# Patient Record
Sex: Male | Born: 1950
Health system: Southern US, Community
[De-identification: ages and names within clinical notes are randomized; demographics above are authoritative.]

## PROBLEM LIST (undated history)

## (undated) DIAGNOSIS — B009 Herpesviral infection, unspecified: Secondary | ICD-10-CM

## (undated) DIAGNOSIS — I10 Essential (primary) hypertension: Secondary | ICD-10-CM

## (undated) DIAGNOSIS — A159 Respiratory tuberculosis unspecified: Secondary | ICD-10-CM

## (undated) DIAGNOSIS — R897 Abnormal histological findings in specimens from other organs, systems and tissues: Secondary | ICD-10-CM

## (undated) DIAGNOSIS — R7611 Nonspecific reaction to tuberculin skin test without active tuberculosis: Secondary | ICD-10-CM

## (undated) DIAGNOSIS — E785 Hyperlipidemia, unspecified: Secondary | ICD-10-CM

## (undated) DIAGNOSIS — B182 Chronic viral hepatitis C: Secondary | ICD-10-CM

## (undated) DIAGNOSIS — A530 Latent syphilis, unspecified as early or late: Secondary | ICD-10-CM

## (undated) HISTORY — DX: Latent syphilis, unspecified as early or late: A53.0

## (undated) HISTORY — DX: Essential (primary) hypertension: I10

## (undated) HISTORY — DX: Hyperlipidemia, unspecified: E78.5

## (undated) HISTORY — DX: Respiratory tuberculosis unspecified: A15.9

## (undated) HISTORY — DX: Herpesviral infection, unspecified: B00.9

## (undated) HISTORY — DX: Abnormal histological findings in specimens from other organs, systems and tissues: R89.7

## (undated) HISTORY — DX: Chronic viral hepatitis C: B18.2

## (undated) HISTORY — DX: Nonspecific reaction to tuberculin skin test without active tuberculosis: R76.11

---

## 1997-03-12 DIAGNOSIS — A530 Latent syphilis, unspecified as early or late: Secondary | ICD-10-CM

## 1997-03-12 DIAGNOSIS — A159 Respiratory tuberculosis unspecified: Secondary | ICD-10-CM

## 1997-03-12 DIAGNOSIS — B182 Chronic viral hepatitis C: Secondary | ICD-10-CM

## 1997-03-12 DIAGNOSIS — R7611 Nonspecific reaction to tuberculin skin test without active tuberculosis: Secondary | ICD-10-CM

## 1997-03-12 HISTORY — DX: Latent syphilis, unspecified as early or late: A53.0

## 1997-03-12 HISTORY — DX: Respiratory tuberculosis unspecified: A15.9

## 1997-03-12 HISTORY — DX: Chronic viral hepatitis C: B18.2

## 1997-03-12 HISTORY — DX: Nonspecific reaction to tuberculin skin test without active tuberculosis: R76.11

## 2012-02-13 ENCOUNTER — Encounter: Payer: Self-pay | Admitting: Internal Medicine

## 2012-02-13 ENCOUNTER — Ambulatory Visit (INDEPENDENT_AMBULATORY_CARE_PROVIDER_SITE_OTHER): Payer: Self-pay | Admitting: Internal Medicine

## 2012-02-13 ENCOUNTER — Other Ambulatory Visit (INDEPENDENT_AMBULATORY_CARE_PROVIDER_SITE_OTHER): Payer: Self-pay

## 2012-02-13 ENCOUNTER — Other Ambulatory Visit: Payer: Self-pay

## 2012-02-13 VITALS — BP 164/94 | HR 70 | Temp 97.6°F | Resp 18 | Ht 69.0 in | Wt 184.2 lb

## 2012-02-13 DIAGNOSIS — Z23 Encounter for immunization: Secondary | ICD-10-CM

## 2012-02-13 DIAGNOSIS — Z0000 Encounter for general adult medical examination without abnormal findings: Secondary | ICD-10-CM

## 2012-02-13 DIAGNOSIS — E7800 Pure hypercholesterolemia, unspecified: Secondary | ICD-10-CM

## 2012-02-13 DIAGNOSIS — I10 Essential (primary) hypertension: Secondary | ICD-10-CM | POA: Insufficient documentation

## 2012-02-13 DIAGNOSIS — N529 Male erectile dysfunction, unspecified: Secondary | ICD-10-CM

## 2012-02-13 LAB — PSA: PSA: 8.48 ng/mL — ABNORMAL HIGH (ref 0.10–4.00)

## 2012-02-13 LAB — URINALYSIS, ROUTINE W REFLEX MICROSCOPIC
Nitrite: NEGATIVE
Total Protein, Urine: NEGATIVE
pH: 6 (ref 5.0–8.0)

## 2012-02-13 LAB — COMPREHENSIVE METABOLIC PANEL
ALT: 35 U/L (ref 0–53)
AST: 36 U/L (ref 0–37)
Albumin: 3.9 g/dL (ref 3.5–5.2)
Alkaline Phosphatase: 81 U/L (ref 39–117)
Glucose, Bld: 78 mg/dL (ref 70–99)
Potassium: 4.8 mEq/L (ref 3.5–5.1)
Sodium: 141 mEq/L (ref 135–145)
Total Protein: 7.1 g/dL (ref 6.0–8.3)

## 2012-02-13 LAB — CBC WITH DIFFERENTIAL/PLATELET
Basophils Absolute: 0 10*3/uL (ref 0.0–0.1)
Eosinophils Absolute: 0.1 10*3/uL (ref 0.0–0.7)
Lymphs Abs: 2.3 10*3/uL (ref 0.7–4.0)
MCHC: 33.4 g/dL (ref 30.0–36.0)
MCV: 95.2 fl (ref 78.0–100.0)
Monocytes Absolute: 0.6 10*3/uL (ref 0.1–1.0)
Neutrophils Relative %: 43.6 % (ref 43.0–77.0)
Platelets: 300 10*3/uL (ref 150.0–400.0)
RDW: 13.4 % (ref 11.5–14.6)
WBC: 5.4 10*3/uL (ref 4.5–10.5)

## 2012-02-13 LAB — LIPID PANEL
Cholesterol: 118 mg/dL (ref 0–200)
LDL Cholesterol: 48 mg/dL (ref 0–99)

## 2012-02-13 LAB — FECAL OCCULT BLOOD, GUAIAC: Fecal Occult Blood: NEGATIVE

## 2012-02-13 MED ORDER — VARDENAFIL HCL 20 MG PO TABS: 20.0000 mg | ORAL_TABLET | Freq: Every day | ORAL | Status: DC | PRN

## 2012-02-13 MED ORDER — OLMESARTAN MEDOXOMIL-HCTZ 20-12.5 MG PO TABS: 1.0000 | ORAL_TABLET | Freq: Every day | ORAL | Status: DC

## 2012-02-13 NOTE — Assessment & Plan Note (Signed)
[facility] CMP TSH today 

## 2012-02-13 NOTE — Progress Notes (Signed)
Subjective:    Patient ID: Tyler Vasquez, male    DOB: 1950-05-01, 61 y.o.   MRN: [id number]  Hypertension This is a chronic problem. The current episode started more than 1 year ago. The problem has been gradually worsening since onset. The problem is uncontrolled. Pertinent negatives include no anxiety, blurred vision, chest pain, headaches, malaise/fatigue, neck pain, orthopnea, palpitations, peripheral edema, PND, shortness of breath or sweats. There are no associated agents to hypertension. Past treatments include calcium channel blockers. The current treatment provides mild improvement. Compliance problems include medication side effects (ED).       Review of Systems  Constitutional: Negative for fever, chills, malaise/fatigue, diaphoresis, activity change, appetite change, fatigue and unexpected weight change.  HENT: Negative.  Negative for neck pain.   Eyes: Negative.  Negative for blurred vision.  Respiratory: Negative for apnea, cough, choking, chest tightness, shortness of breath, wheezing and stridor.   Cardiovascular: Negative.  Negative for chest pain, palpitations, orthopnea and PND.  Gastrointestinal: Negative.   Genitourinary: Negative for urgency, frequency, hematuria, flank pain, decreased urine volume, difficulty urinating, penile pain and testicular pain.  Musculoskeletal: Negative for myalgias, back pain, joint swelling, arthralgias and gait problem.  Skin: Negative for color change, pallor, rash and wound.  Neurological: Negative for dizziness, tremors, seizures, syncope, facial asymmetry, speech difficulty, weakness, light-headedness, numbness and headaches.  Hematological: Negative for adenopathy. Does not bruise/bleed easily.  Psychiatric/Behavioral: Negative.        Objective:   Physical Exam  Vitals reviewed. Constitutional: He is oriented to person, place, and time. He appears well-developed and well-nourished. No distress.  HENT:  Head: Normocephalic and  atraumatic.  Mouth/Throat: Oropharynx is clear and moist. No oropharyngeal exudate.  Eyes: Conjunctivae normal are normal. Right eye exhibits no discharge. Left eye exhibits no discharge. No scleral icterus.  Neck: Normal range of motion. Neck supple. No JVD present. No tracheal deviation present. No thyromegaly present.  Cardiovascular: Normal rate, regular rhythm, normal heart sounds and intact distal pulses.  Exam reveals no gallop and no friction rub.   No murmur heard. Pulmonary/Chest: Effort normal and breath sounds normal. No stridor. No respiratory distress. He has no wheezes. He has no rales. He exhibits no tenderness.  Abdominal: Soft. Bowel sounds are normal. He exhibits no distension and no mass. There is no tenderness. There is no rebound and no guarding. Hernia confirmed negative in the right inguinal area and confirmed negative in the left inguinal area.  Genitourinary: Rectum normal, testes normal and penis normal. Rectal exam shows no external hemorrhoid, no internal hemorrhoid, no fissure, no mass, no tenderness and anal tone normal. Guaiac negative stool. Prostate is enlarged (1+ BPH, right lobe larger than left lobe). Prostate is not tender. Right testis shows no mass, no swelling and no tenderness. Right testis is descended. Left testis shows no mass, no swelling and no tenderness. Left testis is descended. Circumcised. No penile erythema or penile tenderness. No discharge found.  Musculoskeletal: Normal range of motion. He exhibits no edema and no tenderness.  Lymphadenopathy:    He has no cervical adenopathy.       Right: No inguinal adenopathy present.       Left: No inguinal adenopathy present.  Neurological: He is oriented to person, place, and time.  Skin: Skin is warm and dry. No rash noted. He is not diaphoretic. No erythema. No pallor.  Psychiatric: He has a normal mood and affect. His behavior is normal. Judgment and thought content normal.  No results found  for this basename: WBC, HGB, HCT, PLT, GLUCOSE, CHOL, TRIG, HDL, LDLDIRECT, LDLCALC, ALT, AST, NA, K, CL, CREATININE, BUN, CO2, TSH, PSA, INR, GLUF, HGBA1C, MICROALBUR      Assessment & Plan:

## 2012-02-13 NOTE — Assessment & Plan Note (Signed)
Stop amlodipine He will try levitra

## 2012-02-13 NOTE — Assessment & Plan Note (Signed)
I have asked him to start benicar-hct I will check his labs today to look for secondary causes and end organ damage

## 2012-02-13 NOTE — Assessment & Plan Note (Signed)
Exam done Vaccines were updated Labs ordered Referred for a colonoscopy Pt ed material was given

## 2012-02-13 NOTE — Patient Instructions (Signed)
Hypertension As your heart beats, it forces blood through your arteries. This force is your blood pressure. If the pressure is too high, it is called hypertension (HTN) or high blood pressure. HTN is dangerous because you may have it and not know it. High blood pressure may mean that your heart has to work harder to pump blood. Your arteries may be narrow or stiff. The extra work puts you at risk for heart disease, stroke, and other problems.  Blood pressure consists of two numbers, a higher number over a lower, 110/72, for example. It is stated as "110 over 72." The ideal is below 120 for the top number (systolic) and under 80 for the bottom (diastolic). Write down your blood pressure today. You should pay close attention to your blood pressure if you have certain conditions such as:  Heart failure.  Prior heart attack.  Diabetes  Chronic kidney disease.  Prior stroke.  Multiple risk factors for heart disease. To see if you have HTN, your blood pressure should be measured while you are seated with your arm held at the level of the heart. It should be measured at least twice. A one-time elevated blood pressure reading (especially in the Emergency Department) does not mean that you need treatment. There may be conditions in which the blood pressure is different between your right and left arms. It is important to see your caregiver soon for a recheck. Most people have essential hypertension which means that there is not a specific cause. This type of high blood pressure may be lowered by changing lifestyle factors such as:  Stress.  Smoking.  Lack of exercise.  Excessive weight.  Drug/tobacco/alcohol use.  Eating less salt. Most people do not have symptoms from high blood pressure until it has caused damage to the body. Effective treatment can often prevent, delay or reduce that damage. TREATMENT  When a cause has been identified, treatment for high blood pressure is directed at the  cause. There are a large number of medications to treat HTN. These fall into several categories, and your caregiver will help you select the medicines that are best for you. Medications may have side effects. You should review side effects with your caregiver. If your blood pressure stays high after you have made lifestyle changes or started on medicines,   Your medication(s) may need to be changed.  Other problems may need to be addressed.  Be certain you understand your prescriptions, and know how and when to take your medicine.  Be sure to follow up with your caregiver within the time frame advised (usually within two weeks) to have your blood pressure rechecked and to review your medications.  If you are taking more than one medicine to lower your blood pressure, make sure you know how and at what times they should be taken. Taking two medicines at the same time can result in blood pressure that is too low. SEEK IMMEDIATE MEDICAL CARE IF:  You develop a severe headache, blurred or changing vision, or confusion.  You have unusual weakness or numbness, or a faint feeling.  You have severe chest or abdominal pain, vomiting, or breathing problems. MAKE SURE YOU:   Understand these instructions.  Will watch your condition.  Will get help right away if you are not doing well or get worse. Document Released: 02/26/2005 Document Revised: 05/21/2011 Document Reviewed: 10/17/2007 ExitCare Patient Information 2013 [employer]. Health Maintenance, Males A healthy lifestyle and preventative care can promote health and wellness.  Maintain   regular health, dental, and eye exams.  Eat a healthy diet. Foods like vegetables, fruits, whole grains, low-fat dairy products, and lean protein foods contain the nutrients you need without too many calories. Decrease your intake of foods high in solid fats, added sugars, and salt. Get information about a proper diet from your caregiver, if  necessary.  Regular physical exercise is one of the most important things you can do for your health. Most adults should get at least 150 minutes of moderate-intensity exercise (any activity that increases your heart rate and causes you to sweat) each week. In addition, most adults need muscle-strengthening exercises on 2 or more days a week.   Maintain a healthy weight. The body mass index (BMI) is a screening tool to identify possible weight problems. It provides an estimate of body fat based on height and weight. Your caregiver can help determine your BMI, and can help you achieve or maintain a healthy weight. For adults 20 years and older:  A BMI below 18.5 is considered underweight.  A BMI of 18.5 to 24.9 is normal.  A BMI of 25 to 29.9 is considered overweight.  A BMI of 30 and above is considered obese.  Maintain normal blood lipids and cholesterol by exercising and minimizing your intake of saturated fat. Eat a balanced diet with plenty of fruits and vegetables. Blood tests for lipids and cholesterol should begin at age 20 and be repeated every 5 years. If your lipid or cholesterol levels are high, you are over 50, or you are a high risk for heart disease, you may need your cholesterol levels checked more frequently.Ongoing high lipid and cholesterol levels should be treated with medicines, if diet and exercise are not effective.  If you smoke, find out from your caregiver how to quit. If you do not use tobacco, do not start.  If you choose to drink alcohol, do not exceed 2 drinks per day. One drink is considered to be 12 ounces (355 mL) of beer, 5 ounces (148 mL) of wine, or 1.5 ounces (44 mL) of liquor.  Avoid use of street drugs. Do not share needles with anyone. Ask for help if you need support or instructions about stopping the use of drugs.  High blood pressure causes heart disease and increases the risk of stroke. Blood pressure should be checked at least every 1 to 2 years.  Ongoing high blood pressure should be treated with medicines if weight loss and exercise are not effective.  If you are 45 to 61 years old, ask your caregiver if you should take aspirin to prevent heart disease.  Diabetes screening involves taking a blood sample to check your fasting blood sugar level. This should be done once every 3 years, after age 45, if you are within normal weight and without risk factors for diabetes. Testing should be considered at a younger age or be carried out more frequently if you are overweight and have at least 1 risk factor for diabetes.  Colorectal cancer can be detected and often prevented. Most routine colorectal cancer screening begins at the age of 50 and continues through age 75. However, your caregiver may recommend screening at an earlier age if you have risk factors for colon cancer. On a yearly basis, your caregiver may provide home test kits to check for hidden blood in the stool. Use of a small camera at the end of a tube, to directly examine the colon (sigmoidoscopy or colonoscopy), can detect the earliest forms of colorectal   cancer. Talk to your caregiver about this at age 50, when routine screening begins. Direct examination of the colon should be repeated every 5 to 10 years through age 75, unless early forms of pre-cancerous polyps or small growths are found.  Hepatitis C blood testing is recommended for all people born from 1945 through 1965 and any individual with known risks for hepatitis C.  Healthy men should no longer receive prostate-specific antigen (PSA) blood tests as part of routine cancer screening. Consult with your caregiver about prostate cancer screening.  Testicular cancer screening is not recommended for adolescents or adult males who have no symptoms. Screening includes self-exam, caregiver exam, and other screening tests. Consult with your caregiver about any symptoms you have or any concerns you have about testicular  cancer.  Practice safe sex. Use condoms and avoid high-risk sexual practices to reduce the spread of sexually transmitted infections (STIs).  Use sunscreen with a sun protection factor (SPF) of 30 or greater. Apply sunscreen liberally and repeatedly throughout the day. You should seek shade when your shadow is shorter than you. Protect yourself by wearing long sleeves, pants, a wide-brimmed hat, and sunglasses year round, whenever you are outdoors.  Notify your caregiver of new moles or changes in moles, especially if there is a change in shape or color. Also notify your caregiver if a mole is larger than the size of a pencil eraser.  A one-time screening for abdominal aortic aneurysm (AAA) and surgical repair of large AAAs by sound wave imaging (ultrasonography) is recommended for ages 65 to 75 years who are current or former smokers.  Stay current with your immunizations. Document Released: 08/25/2007 Document Revised: 05/21/2011 Document Reviewed: 07/24/2010 ExitCare Patient Information 2013 [employer].  

## 2012-04-16 ENCOUNTER — Encounter: Payer: Self-pay | Admitting: Internal Medicine

## 2012-04-16 ENCOUNTER — Ambulatory Visit (INDEPENDENT_AMBULATORY_CARE_PROVIDER_SITE_OTHER)
Admission: RE | Admit: 2012-04-16 | Discharge: 2012-04-16 | Disposition: A | Discharge status: Home / Self Care | Payer: Self-pay | Source: Ambulatory Visit | Attending: Internal Medicine | Admitting: Internal Medicine

## 2012-04-16 ENCOUNTER — Other Ambulatory Visit: Payer: Self-pay

## 2012-04-16 ENCOUNTER — Ambulatory Visit (INDEPENDENT_AMBULATORY_CARE_PROVIDER_SITE_OTHER): Payer: Self-pay | Admitting: Internal Medicine

## 2012-04-16 VITALS — BP 112/64 | HR 83 | Temp 97.6°F | Resp 16 | Wt 190.2 lb

## 2012-04-16 DIAGNOSIS — J189 Pneumonia, unspecified organism: Secondary | ICD-10-CM

## 2012-04-16 DIAGNOSIS — I10 Essential (primary) hypertension: Secondary | ICD-10-CM

## 2012-04-16 DIAGNOSIS — R7611 Nonspecific reaction to tuberculin skin test without active tuberculosis: Secondary | ICD-10-CM

## 2012-04-16 DIAGNOSIS — B182 Chronic viral hepatitis C: Secondary | ICD-10-CM

## 2012-04-16 DIAGNOSIS — A530 Latent syphilis, unspecified as early or late: Secondary | ICD-10-CM

## 2012-04-16 DIAGNOSIS — R05 Cough: Secondary | ICD-10-CM

## 2012-04-16 DIAGNOSIS — N529 Male erectile dysfunction, unspecified: Secondary | ICD-10-CM

## 2012-04-16 DIAGNOSIS — R059 Cough, unspecified: Secondary | ICD-10-CM

## 2012-04-16 DIAGNOSIS — R972 Elevated prostate specific antigen [PSA]: Secondary | ICD-10-CM

## 2012-04-16 MED ORDER — LEVOFLOXACIN 500 MG PO TABS: 500.0000 mg | ORAL_TABLET | Freq: Every day | ORAL | Status: DC

## 2012-04-16 MED ORDER — TADALAFIL 20 MG PO TABS: 20.0000 mg | ORAL_TABLET | Freq: Every day | ORAL | Status: DC | PRN

## 2012-04-16 NOTE — Assessment & Plan Note (Signed)
His BP is well controlled 

## 2012-04-16 NOTE — Assessment & Plan Note (Signed)
I will check him for [facility] with an AFP and U/S I will recheck his viral load as well

## 2012-04-16 NOTE — Assessment & Plan Note (Signed)
I will recheck his RPR

## 2012-04-16 NOTE — Assessment & Plan Note (Signed)
CXR today shows right hilar infiltrate

## 2012-04-16 NOTE — Assessment & Plan Note (Signed)
I have asked him to see urology

## 2012-04-16 NOTE — Patient Instructions (Signed)
Erectile Dysfunction  Erectile dysfunction (ED) is the inability to get a good enough erection to have sexual intercourse. ED may involve:  · Inability to get an erection.  · Lack of enough hardness to allow penetration.  · Loss of the erection before sex is finished.  · Premature ejaculation.  · Any combination of these problems if they occur more than 25% of the time.  CAUSES  · Certain drugs, such as:  · Pain relievers.  · Antihistamines.  · Antidepressants.  · Blood pressure medicines.  · Water pills.  · Ulcer medicines.  · Muscle relaxants.  · Illegal drugs.  · Excessive drinking.  · Psychological causes, such as:  · Anxiety.  · Depression.  · Sadness.  · Exhaustion.  · Performance fear.  · Stress.  · Physical causes, such as:  · Artery problems. This may include diabetes, smoking, liver disease, or atherosclerosis.  · High blood pressure.  · Hormonal problems, such as low testosterone.  · Obesity.  · Nerve problems. This may include back or pelvic injuries, diabetes, multiple sclerosis, Parkinson's disease, or some surgeries.  SYMPTOMS  · Inability to get an erection.  · Lack of enough hardness to allow penetration.  · Loss of the erection before sex is finished.  · Premature ejaculation.  · Normal erections at some times, but with frequent unsatisfactory episodes.  · Orgasms that are not satisfactory in sensation or frequency.  · Low sexual satisfaction in either partner because of erection problems.  · A curved penis occurring with erection. The curve may cause pain or may be too curved to allow for intercourse.  · Never having nighttime erections.  DIAGNOSIS  Your caregiver can often diagnose this condition by:  · Performing a physical exam to find other diseases or specific problems with the penis.  · Asking you detailed questions about the problem.  · Performing blood tests to check for diabetes or to measure hormone levels.  · Performing urine tests to find other underlying health  conditions.  · Performing an ultrasound to check for scarring.  · Performing a test to check blood flow to the penis.  · Doing a sleep study at home to measure nighttime erections.  TREATMENT   · You may be prescribed medicines by mouth.  · You may be given medicine injections into the penis.  · You may be prescribed a vacuum pump with a ring.  · Penile implant surgery may be performed. You may receive:  · An inflatable implant.  · A semi-rigid implant.  · Blood vessel surgery may be performed.  HOME CARE INSTRUCTIONS  · Take all medicine as directed by your caregiver. Do not take any other medicines without talking to your caregiver first.  · Follow your caregiver's directions for specific treatments as prescribed.  · Follow up with your caregiver as directed.  Document Released: 02/24/2000 Document Revised: 05/21/2011 Document Reviewed: 06/18/2010  ExitCare® Patient Information ©2013 [employer].

## 2012-04-16 NOTE — Assessment & Plan Note (Signed)
Levitra did not help so he will cialis I have asked him to see urology

## 2012-04-16 NOTE — Assessment & Plan Note (Signed)
I will check his CXR today 

## 2012-04-16 NOTE — Assessment & Plan Note (Signed)
+   CXR today He will start levaquin for this

## 2012-04-16 NOTE — Progress Notes (Signed)
  Subjective:    Patient ID: Tyler Vasquez, male    DOB: Mar 01, 1951, 62 y.o.   MRN: [id number]  Cough This is a recurrent problem. The current episode started in the past 7 days. The problem has been unchanged. The cough is non-productive. Pertinent negatives include no chest pain, chills, ear congestion, ear pain, fever, headaches, heartburn, hemoptysis, myalgias, nasal congestion, postnasal drip, rash, rhinorrhea, sore throat, shortness of breath, sweats, weight loss or wheezing. Nothing aggravates the symptoms. He has tried nothing for the symptoms. His past medical history is significant for pneumonia.      Review of Systems  Constitutional: Negative for fever, chills, weight loss, diaphoresis, activity change, appetite change, fatigue and unexpected weight change.  HENT: Negative.  Negative for ear pain, sore throat, rhinorrhea and postnasal drip.   Eyes: Negative.   Respiratory: Positive for cough. Negative for apnea, hemoptysis, choking, chest tightness, shortness of breath, wheezing and stridor.   Cardiovascular: Negative for chest pain, palpitations and leg swelling.  Gastrointestinal: Negative for heartburn, nausea, vomiting, abdominal pain, diarrhea, constipation and blood in stool.  Genitourinary: Negative.   Musculoskeletal: Negative for myalgias, back pain, joint swelling, arthralgias and gait problem.  Skin: Negative for color change, pallor, rash and wound.  Neurological: Negative for dizziness, facial asymmetry, speech difficulty, weakness, light-headedness and headaches.  Hematological: Negative for adenopathy. Does not bruise/bleed easily.  Psychiatric/Behavioral: Negative.        Objective:   Physical Exam  Vitals reviewed. Constitutional: He is oriented to person, place, and time. He appears well-developed and well-nourished.  Non-toxic appearance. He does not have a sickly appearance. He does not appear ill. No distress.  HENT:  Head: Normocephalic and atraumatic.   Mouth/Throat: Oropharynx is clear and moist. No oropharyngeal exudate.  Eyes: Conjunctivae normal are normal. Right eye exhibits no discharge. Left eye exhibits no discharge. No scleral icterus.  Neck: Normal range of motion. Neck supple. No JVD present. No tracheal deviation present. No thyromegaly present.  Cardiovascular: Normal rate, regular rhythm, normal heart sounds and intact distal pulses.  Exam reveals no gallop and no friction rub.   No murmur heard. Pulmonary/Chest: Effort normal and breath sounds normal. No stridor. No respiratory distress. He has no wheezes. He has no rales. He exhibits no tenderness.  Abdominal: Soft. Bowel sounds are normal. He exhibits no distension. There is no tenderness. There is no rebound and no guarding.  Musculoskeletal: Normal range of motion. He exhibits no edema and no tenderness.  Lymphadenopathy:    He has no cervical adenopathy.  Neurological: He is oriented to person, place, and time.  Skin: Skin is warm and dry. No rash noted. He is not diaphoretic. No erythema. No pallor.  Psychiatric: He has a normal mood and affect. His behavior is normal. Judgment and thought content normal.      Lab Results  Component Value Date   WBC 5.4 02/13/2012   HGB 14.8 02/13/2012   HCT 44.2 02/13/2012   PLT 300.0 02/13/2012   GLUCOSE 78 02/13/2012   CHOL 118 02/13/2012   TRIG 148.0 02/13/2012   HDL 40.30 02/13/2012   LDLCALC 48 02/13/2012   ALT 35 02/13/2012   AST 36 02/13/2012   NA 141 02/13/2012   K 4.8 02/13/2012   CL 104 02/13/2012   CREATININE 1.1 02/13/2012   BUN 14 02/13/2012   CO2 31 02/13/2012   TSH 0.60 02/13/2012   PSA 8.48* 02/13/2012      Assessment & Plan:

## 2012-04-17 LAB — HEPATITIS C RNA QUANTITATIVE

## 2012-04-17 LAB — AFP TUMOR MARKER: AFP-Tumor Marker: 5.7 ng/mL (ref 0.0–8.0)

## 2012-04-17 LAB — RPR

## 2012-04-18 ENCOUNTER — Encounter: Payer: Self-pay | Admitting: Internal Medicine

## 2012-04-21 ENCOUNTER — Telehealth: Payer: Self-pay | Admitting: Internal Medicine

## 2012-04-21 NOTE — Telephone Encounter (Signed)
Pt has questions on his medication, cialis. Please call him.

## 2012-04-22 NOTE — Telephone Encounter (Signed)
Returned call to patient// per voice prompt number d/c. Closing phone note until pt calls back

## 2012-04-30 ENCOUNTER — Telehealth: Payer: Self-pay

## 2012-04-30 ENCOUNTER — Encounter: Payer: Self-pay | Admitting: Internal Medicine

## 2012-04-30 NOTE — Telephone Encounter (Signed)
Patient called [facility] requesting samples of Benicar (placed upfront). Returned call to pt, phone # d/c// closing phone note until pt calls back. Need updated phone number to reach patient.

## 2012-09-08 ENCOUNTER — Other Ambulatory Visit: Payer: Self-pay | Admitting: Internal Medicine

## 2012-09-08 DIAGNOSIS — I10 Essential (primary) hypertension: Secondary | ICD-10-CM

## 2012-09-08 MED ORDER — OLMESARTAN MEDOXOMIL-HCTZ 20-12.5 MG PO TABS: 1.0000 | ORAL_TABLET | Freq: Every day | ORAL | Status: DC

## 2012-09-08 NOTE — Telephone Encounter (Signed)
Pt called req refill for benica and cholesterol (pt doesn't remember the name of this med) medicine that Dr. Yetta Barre gave him last office visit. Pt request for these med to be send into [facility] on [address] [phone]. Please advise

## 2012-09-08 NOTE — Telephone Encounter (Signed)
Please advise on a  cholesterol medication for pt. He does not recall a name and I didn't see one listed in Epic Thanks

## 2012-09-09 ENCOUNTER — Telehealth: Payer: Self-pay

## 2012-09-09 DIAGNOSIS — I10 Essential (primary) hypertension: Secondary | ICD-10-CM

## 2012-09-09 MED ORDER — LOSARTAN POTASSIUM-HCTZ 100-12.5 MG PO TABS: 1.0000 | ORAL_TABLET | Freq: Every day | ORAL | Status: DC

## 2012-09-09 NOTE — Telephone Encounter (Signed)
Changed to losartan Please ask him to come in soon for a follow up

## 2012-09-09 NOTE — Telephone Encounter (Signed)
Received pharmacy rejection stating that insurance will not cover Benicar-HCT without a prior authorization. Preferred alternatives are enalapril-hctz, Irbesartan-Hctz, Lisinopril-HCTZ or Losartan potass-HCTZ    . Please advise if you want to proceed with PA or switch medication. Thanks

## 2012-10-15 ENCOUNTER — Telehealth: Payer: Self-pay | Admitting: Internal Medicine

## 2012-10-15 NOTE — Telephone Encounter (Signed)
Pt notified of medication change due to insurance denial. Will schedule a follow up appt and pick up rx

## 2012-10-15 NOTE — Telephone Encounter (Signed)
Patient would like to know if he could get some samples of benicar.

## 2012-10-27 ENCOUNTER — Ambulatory Visit: Payer: Self-pay | Admitting: Internal Medicine

## 2013-04-26 ENCOUNTER — Other Ambulatory Visit: Payer: Self-pay | Admitting: Internal Medicine

## 2013-06-25 ENCOUNTER — Encounter (HOSPITAL_COMMUNITY): Payer: Self-pay | Admitting: Emergency Medicine

## 2013-06-25 ENCOUNTER — Emergency Department (HOSPITAL_COMMUNITY)
Admission: EM | Admit: 2013-06-25 | Discharge: 2013-06-25 | Disposition: A | Discharge status: Home / Self Care | Payer: BC Managed Care – PPO | Attending: Emergency Medicine | Admitting: Emergency Medicine

## 2013-06-25 DIAGNOSIS — Z09 Encounter for follow-up examination after completed treatment for conditions other than malignant neoplasm: Secondary | ICD-10-CM | POA: Insufficient documentation

## 2013-06-25 DIAGNOSIS — Z87891 Personal history of nicotine dependence: Secondary | ICD-10-CM | POA: Insufficient documentation

## 2013-06-25 DIAGNOSIS — I10 Essential (primary) hypertension: Secondary | ICD-10-CM

## 2013-06-25 DIAGNOSIS — Z8619 Personal history of other infectious and parasitic diseases: Secondary | ICD-10-CM | POA: Insufficient documentation

## 2013-06-25 DIAGNOSIS — Z79899 Other long term (current) drug therapy: Secondary | ICD-10-CM | POA: Insufficient documentation

## 2013-06-25 DIAGNOSIS — Z792 Long term (current) use of antibiotics: Secondary | ICD-10-CM | POA: Insufficient documentation

## 2013-06-25 DIAGNOSIS — Z8639 Personal history of other endocrine, nutritional and metabolic disease: Secondary | ICD-10-CM | POA: Insufficient documentation

## 2013-06-25 DIAGNOSIS — Z862 Personal history of diseases of the blood and blood-forming organs and certain disorders involving the immune mechanism: Secondary | ICD-10-CM | POA: Insufficient documentation

## 2013-06-25 MED ORDER — LOSARTAN POTASSIUM-HCTZ 100-12.5 MG PO TABS: 1.0000 | ORAL_TABLET | Freq: Every day | ORAL | Status: DC

## 2013-06-25 NOTE — Discharge Instructions (Signed)
Take your blood pressure medicine as directed. Refer to attached documents for more information. Return to the ED with worsening or concerning symptoms. Return to the ED with worsening or concerning symptoms.

## 2013-06-25 NOTE — ED Provider Notes (Signed)
CSN: [id number]     Arrival date & time 06/25/13  1427 History  This chart was scribed for non-physician practitioner, Emilia BeckKaitlyn Jiovanna Frei, PA-C working with Laray AngerKathleen M McManus, DO by Greggory StallionKayla Andersen, ED scribe. This patient was seen in room TR05C/TR05C and the patient's care was started at 3:07 PM.   Chief Complaint  Patient presents with  . Hypertension   The history is provided by the patient. No language interpreter was used.   HPI Comments: Festus AloeBernard Kana is a 63 y.o. male who presents to the Emergency Department complaining of hypertension. Pt states he stopped taking his blood pressure medications last month. He checked his blood pressure today and it was 216/117. Pt thinks he used the machine wrong. He tried to call his PCP today to get an appointment and was told to come here instead. Denies visual disturbance, headache, dizziness.   Past Medical History  Diagnosis Date  . Hypertension   . Hyperlipidemia   . Hep C w/o coma, chronic 1999  . Syphili, latent 1999  . PPD positive 1999   History reviewed. No pertinent past surgical history. Family History  Problem Relation Age of Onset  . Alcohol abuse Other   . Hypertension Other   . Hypertension Mother   . Alcohol abuse Father   . Cancer Neg Hx   . Diabetes Neg Hx   . Early death Neg Hx   . Heart disease Neg Hx   . Hyperlipidemia Neg Hx   . Kidney disease Neg Hx   . Stroke Neg Hx    History  Substance Use Topics  . Smoking status: Former Games developermoker  . Smokeless tobacco: Never Used  . Alcohol Use: No    Review of Systems  Eyes: Negative for visual disturbance.  Neurological: Negative for dizziness and headaches.  All other systems reviewed and are negative.  Allergies  Amlodipine  Home Medications   Prior to Admission medications   Medication Sig Start Date End Date Taking? Authorizing Provider  levofloxacin (LEVAQUIN) 500 MG tablet Take 1 tablet (500 mg total) by mouth daily. 04/16/12   Etta Grandchildhomas L Jones, MD   losartan-hydrochlorothiazide (HYZAAR) 100-12.5 MG per tablet Take 1 tablet by mouth daily. 09/09/12   Etta Grandchildhomas L Jones, MD  tadalafil (CIALIS) 20 MG tablet Take 1 tablet (20 mg total) by mouth daily as needed for erectile dysfunction. 04/16/12   Etta Grandchildhomas L Jones, MD   BP 147/76  Pulse 66  Temp(Src) 98.6 F (37 C) (Oral)  Resp 16  Ht 5\' 9"  (1.753 m)  Wt 181 lb 11.2 oz (82.419 kg)  BMI 26.82 kg/m2  SpO2 98%  Physical Exam  Nursing note and vitals reviewed. Constitutional: He is oriented to person, place, and time. He appears well-developed and well-nourished. No distress.  HENT:  Head: Normocephalic and atraumatic.  Eyes: EOM are normal.  Neck: Neck supple. No tracheal deviation present.  Cardiovascular: Normal rate, regular rhythm and normal heart sounds.   Pulmonary/Chest: Effort normal and breath sounds normal. No respiratory distress. He has no wheezes. He has no rales.  Musculoskeletal: Normal range of motion.  Neurological: He is alert and oriented to person, place, and time.  Skin: Skin is warm and dry.  Psychiatric: He has a normal mood and affect. His behavior is normal.    ED Course  Procedures (including critical care time)  DIAGNOSTIC STUDIES: Oxygen Saturation is 98% on RA, normal by my interpretation.    COORDINATION OF CARE: 3:08 PM-Discussed treatment plan which includes refilling blood  pressure medication with pt at bedside and pt agreed to plan.   Labs Review Labs Reviewed - No data to display  Imaging Review No results found.   EKG Interpretation None      MDM   Final diagnoses:  Follow up    Patient's blood pressure unremarkable at this time. Patient will have his medication refilled and recommended follow up with his PCP. Patient is asymptomatic at this time. Patient instructed to return with worsening or concerning symptoms.   I personally performed the services described in this documentation, which was scribed in my presence. The recorded  information has been reviewed and is accurate.  Emilia BeckKaitlyn Koralyn Prestage, PA-C 06/25/13 1606

## 2013-06-25 NOTE — ED Notes (Signed)
He stopped taking his BP medication last month and when he checked his BP today at home it was high. He denies any complaints today.

## 2013-06-25 NOTE — ED Notes (Signed)
Pt states he has more of his BP meds at home. He understands that he needs to continue taking his meds regularlly.

## 2013-06-26 NOTE — ED Provider Notes (Signed)
Medical screening examination/treatment/procedure(s) were performed by non-physician practitioner and as supervising physician I was immediately available for consultation/collaboration.   EKG Interpretation None        Laray AngerKathleen M Raianna Slight, DO 06/26/13 [phone]

## 2013-12-04 ENCOUNTER — Ambulatory Visit (INDEPENDENT_AMBULATORY_CARE_PROVIDER_SITE_OTHER): Payer: BC Managed Care – PPO | Admitting: Family Medicine

## 2013-12-04 VITALS — BP 146/92 | HR 60 | Temp 97.5°F | Resp 16 | Ht 67.0 in | Wt 178.4 lb

## 2013-12-04 DIAGNOSIS — H00013 Hordeolum externum right eye, unspecified eyelid: Secondary | ICD-10-CM

## 2013-12-04 DIAGNOSIS — H00019 Hordeolum externum unspecified eye, unspecified eyelid: Secondary | ICD-10-CM

## 2013-12-04 MED ORDER — AMOXICILLIN-POT CLAVULANATE 875-125 MG PO TABS: 1.0000 | ORAL_TABLET | Freq: Two times a day (BID) | ORAL | Status: DC

## 2013-12-04 NOTE — Progress Notes (Signed)
[facility] [address] [place] [address] [phone]  Date:  12/04/2013   Name:  Tyler Vasquez   DOB:  1950-05-04   MRN:  [id number]  PCP:  Sanda Linger, MD    Chief Complaint: Eye Problem and Edema   History of Present Illness:  Tyler Vasquez is a 63 y.o. very pleasant male patient who presents with the following:  He is here today with a concern about his right eye.  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  The left eye seems to be ok.   No corrective lenses.  His vision seems to be ok.   The eye does not hurt, but feels irritated. The globe itself actually seems ok, but he notes a puffy area under the lower lid. Wonders if it might be a stye, it seems to be getting larger No photophobia.  He does not have an eye doctor   Patient Active Problem List   Diagnosis Date Noted  . PSA elevation 04/16/2012  . PPD positive 04/16/2012  . Cough 04/16/2012  . Syphili, latent 04/16/2012  . Hep C w/o coma, chronic 04/16/2012  . Pneumonia 04/16/2012  . Essential hypertension, benign 02/13/2012  . Routine general medical examination at a health care facility 02/13/2012  . Pure hypercholesterolemia 02/13/2012  . ED (erectile dysfunction) 02/13/2012    Past Medical History  Diagnosis Date  . Hypertension   . Hyperlipidemia   . Hep C w/o coma, chronic 1999  . Syphili, latent 1999  . PPD positive 1999    History reviewed. No pertinent past surgical history.  History  Substance Use Topics  . Smoking status: Former Games developer  . Smokeless tobacco: Never Used  . Alcohol Use: No    Family History  Problem Relation Age of Onset  . Alcohol abuse Other   . Hypertension Other   . Hypertension Mother   . Alcohol abuse Father   . Cancer Neg Hx   . Diabetes Neg Hx   . Early death Neg Hx   . Heart disease Neg Hx   . Hyperlipidemia Neg Hx   . Kidney disease Neg  Hx   . Stroke Neg Hx     Allergies  Allergen Reactions  . Amlodipine     ed    Medication list has been reviewed and updated.  Current Outpatient Prescriptions on File Prior to Visit  Medication Sig Dispense Refill  . Alprostadil (PROSTAGLANDIN E1) POWD 40 mcg/mL by Intracavernosal route daily as needed (erectile dysfunction). May use up to 1 ml (40 mcg)/dose.      . losartan-hydrochlorothiazide (HYZAAR) 100-12.5 MG per tablet Take 1 tablet by mouth daily.  90 tablet  1  . Multiple Vitamin (MULTIVITAMIN WITH MINERALS) TABS tablet Take 1 tablet by mouth daily.      . tadalafil (CIALIS) 20 MG tablet Take 1 tablet (20 mg total) by mouth daily as needed for erectile dysfunction.  9 tablet  0   No current facility-administered medications on file prior to visit.    Review of Systems:  As per HPI- otherwise negative.   Physical Examination: Filed Vitals:   12/04/13 1652  BP: 146/92  Pulse: 60  Temp: 97.5 F (36.4 C)  Resp: 16   Filed Vitals:   12/04/13 1652  Height:  (1.702 m)  Weight: 178 lb 6.4 oz (80.922  kg)   Body mass index is 27.93 kg/(m^2). Ideal Body Weight: Weight in (lb) to have BMI = 25: 159.3  GEN: WDWN, NAD, Non-toxic, A & O x 3, looks well HEENT: Atraumatic, Normocephalic. Neck supple. No masses, No LAD.  Bilateral TM wnl, oropharynx normal.  PEERL,EOMI.   Negative limited fundoscopic exam Ears and Nose: No external deformity. CV: RRR, No M/G/R. No JVD. No thrill. No extra heart sounds. PULM: CTA B, no wheezes, crackles, rhonchi. No retractions. No resp. distress. No accessory muscle use. EXTR: No c/c/e NEURO Normal gait.  PSYCH: Normally interactive. Conversant. Not depressed or anxious appearing.  Calm demeanor.  Right eye;  There is a large apparent stye in the inferior conjunctivae.   negaitve fluorescin stain  Assessment and Plan: Stye, right - Plan: amoxicillin-clavulanate (AUGMENTIN) 875-125 MG per tablet  Large stye as above, not yet  ruptured.  He will work on hot compresses and gentle massage, augmentin.  If not better by Monday he will call and we can refer to an eye doctor- come back sooner if worse.   Signed Abbe Amsterdam, MD

## 2013-12-04 NOTE — Patient Instructions (Signed)
Use warm compresses and gentle massage, and take the antibiotic pills. If the area has not drained by Monday please call me and I will have you see an eye doctor.  Let us know sooner if you get worse!

## 2014-03-09 NOTE — Telephone Encounter (Signed)
na

## 2014-03-24 ENCOUNTER — Telehealth: Payer: Self-pay | Admitting: Internal Medicine

## 2014-03-24 DIAGNOSIS — I10 Essential (primary) hypertension: Secondary | ICD-10-CM

## 2014-03-24 NOTE — Telephone Encounter (Signed)
patient requesting refill of lovastatin to [facility] in [place]

## 2014-03-25 MED ORDER — LOSARTAN POTASSIUM-HCTZ 100-12.5 MG PO TABS: 1.0000 | ORAL_TABLET | Freq: Every day | ORAL | Status: DC

## 2014-03-25 NOTE — Telephone Encounter (Signed)
No lovastatin listed on current medication list, only losartan which was last filled 06/2013.

## 2014-04-30 ENCOUNTER — Telehealth: Payer: Self-pay | Admitting: Internal Medicine

## 2014-04-30 NOTE — Telephone Encounter (Signed)
Pt called in and said that she lost her Losartan.  She can not find it anywhere.  She wants to know if Dr Yetta Barrejones can call in more for her?  Told her he wasn't in office today, she ok just call her and let her know if more could be called in?

## 2014-04-30 NOTE — Telephone Encounter (Signed)
Pt also wanted to make sure that we note that her new pharmacy is   [facility] on [address] in [place]

## 2014-05-03 NOTE — Telephone Encounter (Signed)
Changed.

## 2014-06-18 ENCOUNTER — Ambulatory Visit (INDEPENDENT_AMBULATORY_CARE_PROVIDER_SITE_OTHER): Payer: BLUE CROSS/BLUE SHIELD | Admitting: Physician Assistant

## 2014-06-18 ENCOUNTER — Ambulatory Visit (INDEPENDENT_AMBULATORY_CARE_PROVIDER_SITE_OTHER): Payer: BLUE CROSS/BLUE SHIELD

## 2014-06-18 VITALS — BP 140/92 | HR 74 | Temp 98.1°F | Resp 16 | Ht 67.0 in | Wt 181.6 lb

## 2014-06-18 DIAGNOSIS — M79672 Pain in left foot: Secondary | ICD-10-CM

## 2014-06-18 DIAGNOSIS — L84 Corns and callosities: Secondary | ICD-10-CM | POA: Diagnosis not present

## 2014-06-18 DIAGNOSIS — R05 Cough: Secondary | ICD-10-CM

## 2014-06-18 DIAGNOSIS — R0989 Other specified symptoms and signs involving the circulatory and respiratory systems: Secondary | ICD-10-CM | POA: Diagnosis not present

## 2014-06-18 DIAGNOSIS — R059 Cough, unspecified: Secondary | ICD-10-CM

## 2014-06-18 MED ORDER — GUAIFENESIN ER 1200 MG PO TB12: 1.0000 | ORAL_TABLET | Freq: Two times a day (BID) | ORAL | Status: DC | PRN

## 2014-06-18 MED ORDER — BENZONATATE 100 MG PO CAPS: 100.0000 mg | ORAL_CAPSULE | Freq: Three times a day (TID) | ORAL | Status: DC | PRN

## 2014-06-18 NOTE — Progress Notes (Signed)
[facility] [address], [place] [id number] [phone]  Date:  06/18/2014   Name:  Tyler Vasquez Sobotta   DOB:  [birth year]   MRN:  [id number]  PCP:  Sanda Lingerhomas Jones, MD    Chief Complaint: Cough and Foot Pain   History of Present Illness:  Tyler Vasquez Hoheisel is a 64 y.o. very pleasant male with PMH listed below who presents with the following:  Patient had a cough 2 weeks ago, but the cough continues to linger.  It is non-productive mostly, but may have some clear mucus if forced.  He has no nasal congestion or runny-nose.  He denies sob or dyspnea.  The cough is slightly worsened at night.  There is no fever.  He denies nausea, vomiting, or diarrhea.    Foot pain: Patient reports 3 weeks of left foot pain.  This was followed by wearing new rubber galoshes over his regular work boots.  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  By the end of the second week, the pain started under his foot with walking.  It is described as sharp.  There is no redness or swelling.  He has soaked in epsom salt and Advil which helped the pain.  The pain is aggravated by hard surfaces.  He denies any more pain in the morning.    Patient Active Problem List   Diagnosis Date Noted  . PSA elevation 04/16/2012  . PPD positive 04/16/2012  . Cough 04/16/2012  . Syphili, latent 04/16/2012  . Hep C w/o coma, chronic 04/16/2012  . Pneumonia 04/16/2012  . Essential hypertension, benign 02/13/2012  . Routine general medical examination at a health care facility 02/13/2012  . Pure hypercholesterolemia 02/13/2012  . ED (erectile dysfunction) 02/13/2012    Past Medical History  Diagnosis Date  . Hypertension   . Hyperlipidemia   . Hep C w/o coma, chronic 1999  . Syphili, latent 1999  . PPD positive 1999    History reviewed. No pertinent past surgical history.  History  Substance Use Topics  . Smoking status: Former Games developermoker  . Smokeless tobacco: Never Used  . Alcohol Use: No     Family History  Problem Relation Age of Onset  . Alcohol abuse Other   . Hypertension Other   . Hypertension Mother   . Alcohol abuse Father   . Cancer Neg Hx   . Diabetes Neg Hx   . Early death Neg Hx   . Heart disease Neg Hx   . Hyperlipidemia Neg Hx   . Kidney disease Neg Hx   . Stroke Neg Hx     Allergies  Allergen Reactions  . Amlodipine     ed    Medication list has been reviewed and updated.  Current Outpatient Prescriptions on File Prior to Visit  Medication Sig Dispense Refill  . Alprostadil (PROSTAGLANDIN E1) POWD 40 mcg/mL by Intracavernosal route daily as needed (erectile dysfunction). May use up to 1 ml (40 mcg)/dose.    . losartan-hydrochlorothiazide (HYZAAR) 100-12.5 MG per tablet Take 1 tablet by mouth daily. 90 tablet 1  . Multiple Vitamin (MULTIVITAMIN WITH MINERALS) TABS tablet Take 1 tablet by mouth daily.    . tadalafil (CIALIS) 20 MG tablet Take 1 tablet (20 mg total) by mouth daily as needed for erectile dysfunction. 9 tablet 0   No current facility-administered medications on file prior to visit.    Review of Systems: ROS otherwise unremarkable unless listed above.  Physical Examination: Filed Vitals:   06/18/14 1752  BP: 140/92  Pulse: 74  Temp: 98.1 F (36.7 C)  Resp: 16   Filed Vitals:   06/18/14 1752  Height:  (1.702 m)  Weight: 181 lb 9.6 oz (82.373 kg)   Body mass index is 28.44 kg/(m^2). Ideal Body Weight: Weight in (lb) to have BMI = 25: 159.3  Physical Exam  Constitutional: He is oriented to person, place, and time. He appears well-developed and well-nourished.  HENT:  Head: Normocephalic and atraumatic.  Right Ear: Tympanic membrane, external ear and ear canal normal. Tympanic membrane is not injected.  Left Ear: Tympanic membrane, external ear and ear canal normal. Tympanic membrane is not injected.  Nose: No mucosal edema or rhinorrhea. Right sinus exhibits no maxillary sinus tenderness and no frontal sinus  tenderness. Left sinus exhibits no maxillary sinus tenderness and no frontal sinus tenderness.  Mouth/Throat: No uvula swelling. No oropharyngeal exudate, posterior oropharyngeal edema or posterior oropharyngeal erythema.  Eyes: Pupils are equal, round, and reactive to light.  Cardiovascular: Normal rate, regular rhythm and intact distal pulses.  Exam reveals no friction rub.   No murmur heard. Pulses:      Carotid pulses are 2+ on the right side, and 2+ on the left side.      Dorsalis pedis pulses are 2+ on the right side, and 2+ on the left side.       Posterior tibial pulses are 2+ on the right side, and 2+ on the left side.  Pulmonary/Chest: Effort normal and breath sounds normal. No respiratory distress. He has no decreased breath sounds. He has no wheezes. He has no rhonchi.  Musculoskeletal:  Left foot with normal sensation, strength, and ROM.  Tender with palpation at thickened callous area plantar side of foot just proximal to metatarsal phalangeal joint.  No fluctuance.  Squeezing joints together at lateral/medial side does not incite pain.    Lymphadenopathy:       Head (right side): No tonsillar, no preauricular and no posterior auricular adenopathy present.       Head (left side): No tonsillar, no preauricular and no posterior auricular adenopathy present.    He has no cervical adenopathy.  Neurological: He is alert and oriented to person, place, and time.  Skin: Skin is warm and dry.  Psychiatric: He has a normal mood and affect. His speech is normal and behavior is normal.   [facility] reading (PRIMARY) by  Dr. Neva Seat: No fractures or abnormal bone findings.  No presence of foreign body or neuroma seen.    Assessment and Plan: 64 year old male is here today for chief complaint of left foot pain and cough.   Calllous Left foot pain - Plan: DG Foot 2 Views Left -Advised to use bunion pad around callous.  Also given metatarsal cookie to place in work shoe. -rtc if pain continues.   May need to shave down.  Cough - Plan: benzonatate (TESSALON) 100 MG capsule, Guaifenesin (MUCINEX MAXIMUM STRENGTH) 1200 MG TB12, DISCONTINUED: benzonatate (TESSALON) 100 MG capsule,   The cough appears to be resolved consistent with respiratory infection likely of viral etiology.  Advised patient of the length of time with coughs, and need for hydration. -Treating supportively with mucinex and tessalon pearls  Chest congestion - Plan: Guaifenesin (MUCINEX MAXIMUM STRENGTH) 1200 MG TB12,   Trena Platt, PA-C [facility] 4/8/20167:18 PM

## 2014-06-18 NOTE — Patient Instructions (Addendum)
Please purchase corn pads at the pharmacy or any [facility] or [employer].  It has a hole in the middle of padding to protect the callous.  You may use a pumice stone here as well. Please let us know if you continue to have the pain at your foot.   Drink plenty of water to help thin out the mucus along with the mucinex.   Upper Respiratory Infection, Adult An upper respiratory infection (URI) is also sometimes known as the common cold. The upper respiratory tract includes the nose, sinuses, throat, trachea, and bronchi. Bronchi are the airways leading to the lungs. Most people improve within 1 week, but symptoms can last up to 2 weeks. A residual cough may last even longer.  CAUSES Many different viruses can infect the tissues lining the upper respiratory tract. The tissues become irritated and inflamed and often become very moist. Mucus production is also common. A cold is contagious. You can easily spread the virus to others by oral contact. This includes kissing, sharing a glass, coughing, or sneezing. Touching your mouth or nose and then touching a surface, which is then touched by another person, can also spread the virus. SYMPTOMS  Symptoms typically develop 1 to 3 days after you come in contact with a cold virus. Symptoms vary from person to person. They may include:  Runny nose.  Sneezing.  Nasal congestion.  Sinus irritation.  Sore throat.  Loss of voice (laryngitis).  Cough.  Fatigue.  Muscle aches.  Loss of appetite.  Headache.  Low-grade fever. DIAGNOSIS  You might diagnose your own cold based on familiar symptoms, since most people get a cold 2 to 3 times a year. Your caregiver can confirm this based on your exam. Most importantly, your caregiver can check that your symptoms are not due to another disease such as strep throat, sinusitis, pneumonia, asthma, or epiglottitis. Blood tests, throat tests, and X-rays are not necessary to diagnose a common cold, but they may  sometimes be helpful in excluding other more serious diseases. Your caregiver will decide if any further tests are required. RISKS AND COMPLICATIONS  You may be at risk for a more severe case of the common cold if you smoke cigarettes, have chronic heart disease (such as heart failure) or lung disease (such as asthma), or if you have a weakened immune system. The very young and very old are also at risk for more serious infections. Bacterial sinusitis, middle ear infections, and bacterial pneumonia can complicate the common cold. The common cold can worsen asthma and chronic obstructive pulmonary disease (COPD). Sometimes, these complications can require emergency medical care and may be life-threatening. PREVENTION  The best way to protect against getting a cold is to practice good hygiene. Avoid oral or hand contact with people with cold symptoms. Wash your hands often if contact occurs. There is no clear evidence that vitamin C, vitamin E, echinacea, or exercise reduces the chance of developing a cold. However, it is always recommended to get plenty of rest and practice good nutrition. TREATMENT  Treatment is directed at relieving symptoms. There is no cure. Antibiotics are not effective, because the infection is caused by a virus, not by bacteria. Treatment may include:  Increased fluid intake. Sports drinks offer valuable electrolytes, sugars, and fluids.  Breathing heated mist or steam (vaporizer or shower).  Eating chicken soup or other clear broths, and maintaining good nutrition.  Getting plenty of rest.  Using gargles or lozenges for comfort.  Controlling fevers with ibuprofen  or acetaminophen as directed by your caregiver.  Increasing usage of your inhaler if you have asthma. Zinc gel and zinc lozenges, taken in the first 24 hours of the common cold, can shorten the duration and lessen the severity of symptoms. Pain medicines may help with fever, muscle aches, and throat pain. A  variety of non-prescription medicines are available to treat congestion and runny nose. Your caregiver can make recommendations and may suggest nasal or lung inhalers for other symptoms.  HOME CARE INSTRUCTIONS   Only take over-the-counter or prescription medicines for pain, discomfort, or fever as directed by your caregiver.  Use a warm mist humidifier or inhale steam from a shower to increase air moisture. This may keep secretions moist and make it easier to breathe.  Drink enough water and fluids to keep your urine clear or pale yellow.  Rest as needed.  Return to work when your temperature has returned to normal or as your caregiver advises. You may need to stay home longer to avoid infecting others. You can also use a face mask and careful hand washing to prevent spread of the virus. SEEK MEDICAL CARE IF:   After the first few days, you feel you are getting worse rather than better.  You need your caregiver's advice about medicines to control symptoms.  You develop chills, worsening shortness of breath, or brown or red sputum. These may be signs of pneumonia.  You develop yellow or brown nasal discharge or pain in the face, especially when you bend forward. These may be signs of sinusitis.  You develop a fever, swollen neck glands, pain with swallowing, or white areas in the back of your throat. These may be signs of strep throat. SEEK IMMEDIATE MEDICAL CARE IF:   You have a fever.  You develop severe or persistent headache, ear pain, sinus pain, or chest pain.  You develop wheezing, a prolonged cough, cough up blood, or have a change in your usual mucus (if you have chronic lung disease).  You develop sore muscles or a stiff neck. Document Released: 08/22/2000 Document Revised: 05/21/2011 Document Reviewed: 06/03/2013 [facility] Patient Information 2015 [place], [place]. This information is not intended to replace advice given to you by your health care provider. Make sure you  discuss any questions you have with your health care provider.

## 2014-07-03 ENCOUNTER — Other Ambulatory Visit: Payer: Self-pay | Admitting: Internal Medicine

## 2014-11-20 ENCOUNTER — Ambulatory Visit (INDEPENDENT_AMBULATORY_CARE_PROVIDER_SITE_OTHER): Payer: BLUE CROSS/BLUE SHIELD | Admitting: Family Medicine

## 2014-11-20 VITALS — BP 130/78 | HR 58 | Temp 98.1°F | Resp 14 | Ht 68.0 in | Wt 189.0 lb

## 2014-11-20 DIAGNOSIS — Z1329 Encounter for screening for other suspected endocrine disorder: Secondary | ICD-10-CM | POA: Diagnosis not present

## 2014-11-20 DIAGNOSIS — I10 Essential (primary) hypertension: Secondary | ICD-10-CM

## 2014-11-20 DIAGNOSIS — Z23 Encounter for immunization: Secondary | ICD-10-CM

## 2014-11-20 DIAGNOSIS — Z0000 Encounter for general adult medical examination without abnormal findings: Secondary | ICD-10-CM

## 2014-11-20 DIAGNOSIS — Z130 Encounter for screening for diseases of the blood and blood-forming organs and certain disorders involving the immune mechanism: Secondary | ICD-10-CM | POA: Diagnosis not present

## 2014-11-20 DIAGNOSIS — Z1211 Encounter for screening for malignant neoplasm of colon: Secondary | ICD-10-CM

## 2014-11-20 DIAGNOSIS — Z1383 Encounter for screening for respiratory disorder NEC: Secondary | ICD-10-CM | POA: Diagnosis not present

## 2014-11-20 DIAGNOSIS — Z136 Encounter for screening for cardiovascular disorders: Secondary | ICD-10-CM | POA: Diagnosis not present

## 2014-11-20 DIAGNOSIS — Z1389 Encounter for screening for other disorder: Secondary | ICD-10-CM | POA: Diagnosis not present

## 2014-11-20 DIAGNOSIS — E7800 Pure hypercholesterolemia, unspecified: Secondary | ICD-10-CM

## 2014-11-20 DIAGNOSIS — B182 Chronic viral hepatitis C: Secondary | ICD-10-CM | POA: Diagnosis not present

## 2014-11-20 DIAGNOSIS — Z1212 Encounter for screening for malignant neoplasm of rectum: Secondary | ICD-10-CM

## 2014-11-20 DIAGNOSIS — R972 Elevated prostate specific antigen [PSA]: Secondary | ICD-10-CM | POA: Diagnosis not present

## 2014-11-20 DIAGNOSIS — A530 Latent syphilis, unspecified as early or late: Secondary | ICD-10-CM

## 2014-11-20 DIAGNOSIS — Z113 Encounter for screening for infections with a predominantly sexual mode of transmission: Secondary | ICD-10-CM | POA: Diagnosis not present

## 2014-11-20 DIAGNOSIS — E663 Overweight: Secondary | ICD-10-CM

## 2014-11-20 LAB — CBC
HCT: 41.6 % (ref 39.0–52.0)
Hemoglobin: 15.2 g/dL (ref 13.0–17.0)
MCH: 32.8 pg (ref 26.0–34.0)
MCHC: 36.5 g/dL — ABNORMAL HIGH (ref 30.0–36.0)
MCV: 89.8 fL (ref 78.0–100.0)
MPV: 9.6 fL (ref 8.6–12.4)
PLATELETS: 279 10*3/uL (ref 150–400)
RBC: 4.63 MIL/uL (ref 4.22–5.81)
RDW: 13.1 % (ref 11.5–15.5)
WBC: 5.3 10*3/uL (ref 4.0–10.5)

## 2014-11-20 LAB — POCT UA - MICROSCOPIC ONLY
BACTERIA, U MICROSCOPIC: NEGATIVE
CRYSTALS, UR, HPF, POC: NEGATIVE
Casts, Ur, LPF, POC: NEGATIVE
Mucus, UA: NEGATIVE
RBC, urine, microscopic: NEGATIVE
Yeast, UA: NEGATIVE

## 2014-11-20 LAB — COMPREHENSIVE METABOLIC PANEL
ALT: 17 U/L (ref 9–46)
AST: 20 U/L (ref 10–35)
Albumin: 3.8 g/dL (ref 3.6–5.1)
Alkaline Phosphatase: 68 U/L (ref 40–115)
BUN: 15 mg/dL (ref 7–25)
CO2: 26 mmol/L (ref 20–31)
Calcium: 8.9 mg/dL (ref 8.6–10.3)
Chloride: 102 mmol/L (ref 98–110)
Creat: 1.15 mg/dL (ref 0.70–1.25)
Glucose, Bld: 88 mg/dL (ref 65–99)
Potassium: 4.2 mmol/L (ref 3.5–5.3)
Sodium: 138 mmol/L (ref 135–146)
Total Bilirubin: 0.6 mg/dL (ref 0.2–1.2)
Total Protein: 7.1 g/dL (ref 6.1–8.1)

## 2014-11-20 LAB — POCT URINALYSIS DIPSTICK
Bilirubin, UA: NEGATIVE
Blood, UA: NEGATIVE
Glucose, UA: NEGATIVE
Ketones, UA: NEGATIVE
Leukocytes, UA: NEGATIVE
Nitrite, UA: NEGATIVE
Protein, UA: NEGATIVE
Spec Grav, UA: 1.02
Urobilinogen, UA: 0.2
pH, UA: 7

## 2014-11-20 LAB — HEPATITIS C ANTIBODY: HCV Ab: REACTIVE — AB

## 2014-11-20 LAB — TSH: TSH: 1.036 u[IU]/mL (ref 0.350–4.500)

## 2014-11-20 LAB — LDL CHOLESTEROL, DIRECT: Direct LDL: 56 mg/dL (ref <130)

## 2014-11-20 LAB — HIV ANTIBODY (ROUTINE TESTING W REFLEX): HIV 1&2 Ab, 4th Generation: NONREACTIVE

## 2014-11-20 MED ORDER — LOSARTAN POTASSIUM-HCTZ 100-12.5 MG PO TABS: 1.0000 | ORAL_TABLET | Freq: Every day | ORAL | Status: DC

## 2014-11-20 NOTE — Progress Notes (Signed)
Subjective:    Patient ID: Tyler Vasquez, male    DOB: [birth year], 64 y.o.   MRN: [id number] This chart was scribed for Tyler Sorenson, MD by Jolene Provost, Medical Scribe. This patient was seen in Room 9 and the patient's care was started a 10:15 AM.  Chief Complaint  Patient presents with  . Annual Exam    HPI HPI Comments: [name] is a 64 y.o. male with a past hx of Hepatitis C and elevated PSA who presents to [facility] reporting for a full physical today. He is not starving him self this morning. He states he gets abundant exercise because he works in [employer], and goes to [employer] He states he sleeps well. He does not take any regular medications. He takes a mens multivitamin intermittently. He does not smoke. He does not remember when his last tetanus shot was. He has no trouble making a urine stream. He denies nocturia. The pt believes he has seen Dr. Retta Diones in the last year, but is not sure of the follow up plan.   As of 2014, the pt's Hepatitis C was undetectable. The pt's last lipis panel in 2013 was perfect, non HDL 78. PSA was at 8 at in 2013. Pt was referred to Dr. Letha Cape at [facility] urology at that time. He did not have insurance at that time, but was seen there several times, last note February 2015. The pt in correctional institution for 16 years where he underwent an ultrasound and biopsy of his prostate, which were normal. The pt also has longstanding ED which did not respond to oral medications, and was started on prostaglandin which did help. Elevated PSA is longstanding, and prior biopsy was normal but report was not available in 2008, so PSA was being monitored in a post massage urine PCA was also being monitored.   He has never had a colonoscopy. He does not have a PCP but would like to est here.   Past Medical History  Diagnosis Date  . Hypertension   . Hyperlipidemia   . Hep C w/o coma, chronic 1999  . Syphili, latent 1999  . PPD positive 1999   Allergies    Allergen Reactions  . Amlodipine     ed   Current Outpatient Prescriptions on File Prior to Visit  Medication Sig Dispense Refill  . losartan-hydrochlorothiazide (HYZAAR) 100-12.5 MG per tablet TAKE 1 TABLET BY MOUTH DAILY 90 tablet 0  . Alprostadil (PROSTAGLANDIN E1) POWD 40 mcg/mL by Intracavernosal route daily as needed (erectile dysfunction). May use up to 1 ml (40 mcg)/dose.    . benzonatate (TESSALON) 100 MG capsule Take 1-2 capsules (100-200 mg total) by mouth 3 (three) times daily as needed for cough. (Patient not taking: Reported on 11/20/2014) 40 capsule 0  . Guaifenesin (MUCINEX MAXIMUM STRENGTH) 1200 MG TB12 Take 1 tablet (1,200 mg total) by mouth every 12 (twelve) hours as needed. (Patient not taking: Reported on 11/20/2014) 14 tablet 0  . Multiple Vitamin (MULTIVITAMIN WITH MINERALS) TABS tablet Take 1 tablet by mouth daily.    . tadalafil (CIALIS) 20 MG tablet Take 1 tablet (20 mg total) by mouth daily as needed for erectile dysfunction. (Patient not taking: Reported on 11/20/2014) 9 tablet 0   No current facility-administered medications on file prior to visit.   History reviewed. No pertinent past surgical history. Family History  Problem Relation Age of Onset  . Alcohol abuse Other   . Hypertension Other   . Hypertension Mother   .  Alcohol abuse Father   . Cancer Neg Hx   . Diabetes Neg Hx   . Early death Neg Hx   . Heart disease Neg Hx   . Hyperlipidemia Neg Hx   . Kidney disease Neg Hx   . Stroke Neg Hx    Social History   Social History  . Marital Status: Single    Spouse Name: N/A  . Number of Children: N/A  . Years of Education: N/A   Social History Main Topics  . Smoking status: Former Games developer  . Smokeless tobacco: Never Used  . Alcohol Use: No  . Drug Use: No  . Sexual Activity: Yes   Other Topics Concern  . None   Social History Narrative   Just released from prison, served a 16 year term for vehicular manslaughter    Review of Systems   Constitutional: Negative for fever and chills.  Respiratory: Positive for cough (Mild chest cold, improving).   Gastrointestinal: Negative for abdominal pain, diarrhea and constipation.  Musculoskeletal: Negative for back pain and gait problem.  Neurological: Negative for headaches.  All other systems reviewed and are negative.     Objective:  BP 130/78 mmHg  Pulse 58  Temp(Src) 98.1 F (36.7 C) (Oral)  Resp 14  Ht 5\' 8"  (1.727 m)  Wt 189 lb (85.73 kg)  BMI 28.74 kg/m2  SpO2 99%  Physical Exam  Constitutional: He is oriented to person, place, and time. He appears well-developed and well-nourished. No distress.  HENT:  Head: Normocephalic and atraumatic.  Right Ear: External ear normal.  Left Ear: External ear normal.  Mouth/Throat: Oropharynx is clear and moist. No oropharyngeal exudate.  TMs normal.  Eyes: Pupils are equal, round, and reactive to light.  Neck: Neck supple. No thyromegaly present.  Cardiovascular: Normal rate, regular rhythm and normal heart sounds.   No murmur heard. Normal S1, S2. 2+ pedal pulses. No lower extremity edema.   Pulmonary/Chest: Effort normal and breath sounds normal. No respiratory distress. He has no wheezes.  Good air movement.  Abdominal: Soft. Bowel sounds are normal. There is no tenderness.  Genitourinary: Rectum normal and prostate normal.  Musculoskeletal: Normal range of motion. He exhibits no edema or tenderness.  Negative straight leg raise.   Lymphadenopathy:    He has no cervical adenopathy.  Neurological: He is alert and oriented to person, place, and time. Coordination normal.  Skin: Skin is warm and dry. He is not diaphoretic.  Psychiatric: He has a normal mood and affect. His behavior is normal.  Nursing note and vitals reviewed.  EKG read by Dr. Clelia Croft, Normal sinus rhythm, but short PRI of 108.     Assessment & Plan:   1. Annual physical exam  - pt wanting to est w/ PCP here.  2. Pure hypercholesterolemia - not  fasting today but last lipid panel was at goal.  3. PSA elevation - followed by Dr. Retta Diones  For sev yrs with psa relativley stable around 8 - pt does not recall last urology OV but may have been about 18 mos prior. Pt reports h/o benign biopsy many yrs prior.  4. Syphili, latent   5. Essential hypertension, benign  Cont hyzaar.   6. Hep C w/o coma, chronic - h/o with + ab but neg viral load sev yr ago - recheck, cons abdd LUQ [place]  7. Need for prophylactic vaccination and inoculation against influenza   8. Screening for thyroid disorder   9. Screening for deficiency anemia  10. Screening for colorectal cancer - refer for initial screening colonoscopy  11. Screening for cardiovascular, respiratory, and genitourinary diseases   12. Routine screening for STI (sexually transmitted infection)   13. Overweight (BMI 25.0-29.9)     Orders Placed This Encounter  Procedures  . Flu Vaccine QUAD 36+ mos IM  . LDL Cholesterol, Direct  . CBC  . Comprehensive metabolic panel  . TSH  . RPR  . HIV antibody  . HSV(herpes simplex vrs) 1+2 ab-IgG  . PSA  . Hepatitis C Ab Reflex HCV RNA, QUANT  . Hepatitis C antibody  . Hepatitis C RNA quantitative  . Ambulatory referral to Gastroenterology    Referral Priority:  Routine    Referral Type:  Consultation    Referral Reason:  Specialty Services Required    Number of Visits Requested:  1  . Ambulatory referral to Urology    Referral Priority:  Routine    Referral Type:  Consultation    Referral Reason:  Specialty Services Required    Referred to Provider:  Marcine Matar, MD    Requested Specialty:  Urology    Number of Visits Requested:  1  . POCT UA - Microscopic Only  . POCT urinalysis dipstick  . EKG 12-Lead    Meds ordered this encounter  Medications  . losartan-hydrochlorothiazide (HYZAAR) 100-12.5 MG per tablet    Sig: Take 1 tablet by mouth daily.    Dispense:  90 tablet    Refill:  3    I personally performed the services  described in this documentation, which was scribed in my presence. The recorded information has been reviewed and considered, and addended by me as needed.  Tyler Sorenson, MD MPH  Results for orders placed or performed in visit on 11/20/14  LDL Cholesterol, Direct  Result Value Ref Range   Direct LDL 56 <130 mg/dL  CBC  Result Value Ref Range   WBC 5.3 4.0 - 10.5 K/uL   RBC 4.63 4.22 - 5.81 MIL/uL   Hemoglobin 15.2 13.0 - 17.0 g/dL   HCT 16.1 09.6 - 04.5 %   MCV 89.8 78.0 - 100.0 fL   MCH 32.8 26.0 - 34.0 pg   MCHC 36.5 (H) 30.0 - 36.0 g/dL   RDW 40.9 81.1 - 91.4 %   Platelets 279 150 - 400 K/uL   MPV 9.6 8.6 - 12.4 fL  Comprehensive metabolic panel  Result Value Ref Range   Sodium 138 135 - 146 mmol/L   Potassium 4.2 3.5 - 5.3 mmol/L   Chloride 102 98 - 110 mmol/L   CO2 26 20 - 31 mmol/L   Glucose, Bld 88 65 - 99 mg/dL   BUN 15 7 - 25 mg/dL   Creat 7.82 9.56 - 2.13 mg/dL   Total Bilirubin 0.6 0.2 - 1.2 mg/dL   Alkaline Phosphatase 68 40 - 115 U/L   AST 20 10 - 35 U/L   ALT 17 9 - 46 U/L   Total Protein 7.1 6.1 - 8.1 g/dL   Albumin 3.8 3.6 - 5.1 g/dL   Calcium 8.9 8.6 - 08.6 mg/dL  TSH  Result Value Ref Range   TSH 1.036 0.350 - 4.500 uIU/mL  RPR  Result Value Ref Range   RPR Ser Ql NON REAC NON REAC  HIV antibody  Result Value Ref Range   HIV 1&2 Ab, 4th Generation NONREACTIVE NONREACTIVE  HSV(herpes simplex vrs) 1+2 ab-IgG  Result Value Ref Range   HSV 1 Glycoprotein G Ab,  IgG <0.10 IV   HSV 2 Glycoprotein G Ab, IgG 7.21 (H) IV  PSA  Result Value Ref Range   PSA 9.16 (H) <=4.00 ng/mL  Hepatitis C antibody  Result Value Ref Range   HCV Ab REACTIVE (A) NEGATIVE  Hepatitis C RNA quantitative  Result Value Ref Range   HCV Quantitative Not Detected <15 IU/mL   HCV Quantitative Log NOT CALC <1.18 log 10  POCT UA - Microscopic Only  Result Value Ref Range   WBC, Ur, HPF, POC 0-1    RBC, urine, microscopic neg    Bacteria, U Microscopic neg    Mucus, UA neg     Epithelial cells, urine per micros 0-1    Crystals, Ur, HPF, POC neg    Casts, Ur, LPF, POC neg    Yeast, UA neg   POCT urinalysis dipstick  Result Value Ref Range   Color, UA yellow    Clarity, UA clear    Glucose, UA neg    Bilirubin, UA neg    Ketones, UA neg    Spec Grav, UA 1.020    Blood, UA neg    pH, UA 7.0    Protein, UA neg    Urobilinogen, UA 0.2    Nitrite, UA neg    Leukocytes, UA Negative Negative

## 2014-11-20 NOTE — Patient Instructions (Signed)

## 2014-11-21 LAB — RPR

## 2014-11-22 ENCOUNTER — Telehealth: Payer: Self-pay

## 2014-11-22 LAB — HSV(HERPES SIMPLEX VRS) I + II AB-IGG
HSV 1 Glycoprotein G Ab, IgG: <0.1 IV
HSV 2 Glycoprotein G Ab, IgG: 7.21 IV — ABNORMAL HIGH

## 2014-11-22 LAB — PSA: PSA: 9.16 ng/mL — ABNORMAL HIGH (ref <=4.00)

## 2014-11-22 NOTE — Telephone Encounter (Signed)
Had a question for Dr.Shaw who referred him to a urologist about an issue he had? He insist on speaking to her.  Please advise   [phone]

## 2014-11-23 NOTE — Telephone Encounter (Signed)
Please touch base w/ pt on my behalf to see what we can help w/.  Thanks.  Immed prior to this second call I had talked w/ the pt on phone at 5:15 re lab results and referral - he was a little difficult to understand due to the phone connections but I reviewed w/ him that all I did was process a referral back to his prior urologist so that he would have insurance authorization for whenever his reg f/u w/ Dr. Retta Diones was sched. Reviewed that not all of labs were back and will send letter in the mail when they all return but released what we currently have to him on MyChart w/ a note. Pt stated he will to set up GI appt for routine colonscopy with [facility] but is worried about whether they are in his BCBS network.    Pt was grateful and very happy with his care here.  He related how when he was at another PCP office sev yrs ago to est he felt like they did a lot of lab tests that he could not afford even though he was upfront about requesting them not to be done for this reason and then he got a 1600 bill so he was just wanting to be sure that extra things that he didn't ask for weren't being done.

## 2014-11-23 NOTE — Telephone Encounter (Signed)
Spoke to pt. He was upset that he had a new urology referral put in and was called from a different urology office. Pt says he has a urologist and did not need a referral. He says he and Dr. Retta Diones "have an understanding."  Pt says once he knows his PSA he will decide if he needs to follow up with Dr. Retta Diones. Pt just wanted to make sure there was no confusion regarding urology.   Pt would like a call regarding lab results and would also like a letter with results mailed.

## 2014-11-23 NOTE — Telephone Encounter (Signed)
Pt called wanting to discuss add'l information... Please call back at [phone].  [phone]

## 2014-11-23 NOTE — Telephone Encounter (Signed)
Tyler Vasquez was not referred to a new urologist - we only sent Dr. Lenoria Chime office information from the visit.  Looks like pt has a MyChart so I will send him a message through that to clear this up.  His PSA was 9 which was slightly higher than prior.

## 2014-11-23 NOTE — Telephone Encounter (Signed)
Please fax a copy of pt's PSA to [facility] Dr. Retta Diones.

## 2014-11-24 ENCOUNTER — Encounter: Payer: Self-pay | Admitting: Family Medicine

## 2014-11-24 LAB — HEPATITIS C RNA QUANTITATIVE: HCV Quantitative: NOT DETECTED IU/mL (ref <15)

## 2014-11-24 NOTE — Telephone Encounter (Signed)
Spoke with Pt and he stated that when he spoke with you yesterday he did not get to finish discussing with you about a lab test for hep. c as well as a herpes test. He is concerned because he wants to clarify with you if you stated while you two were talking if he had herpes.

## 2014-11-24 NOTE — Telephone Encounter (Signed)
Tried to reach out to Pt no answer, left a message to call back.

## 2014-11-25 ENCOUNTER — Ambulatory Visit (INDEPENDENT_AMBULATORY_CARE_PROVIDER_SITE_OTHER): Payer: BLUE CROSS/BLUE SHIELD | Admitting: Emergency Medicine

## 2014-11-25 ENCOUNTER — Telehealth: Payer: Self-pay | Admitting: Family Medicine

## 2014-11-25 VITALS — BP 134/82 | HR 70 | Temp 98.4°F | Resp 18 | Ht 67.5 in | Wt 187.0 lb

## 2014-11-25 DIAGNOSIS — A6000 Herpesviral infection of urogenital system, unspecified: Secondary | ICD-10-CM | POA: Diagnosis not present

## 2014-11-25 NOTE — Telephone Encounter (Signed)
Yes, he has type 2 herpes. Once you get this virus, you keep it for life and it is possible to pass it to partners even when there is not an outbreak Outbreak presents as a sore and he should start an antiviral like valtrax immed if he gets one - more likely to happen when he is ill or stressed. If happens freq should stay on an antiviral to prevent.  Transmission is dramatically less likely with condom and no outbreak and on med but not impossible.

## 2014-11-25 NOTE — Patient Instructions (Signed)
Genital Herpes °Genital herpes is a sexually transmitted disease. This means that it is a disease passed by having sex with an infected person. There is no cure for genital herpes. The time between attacks can be months to years. The virus may live in a person but produce no problems (symptoms). This infection can be passed to a baby as it travels down the birth canal (vagina). In a newborn, this can cause central nervous system damage, eye damage, or even death. The virus that causes genital herpes is usually HSV-2 virus. The virus that causes oral herpes is usually HSV-1. The diagnosis (learning what is wrong) is made through culture results. °SYMPTOMS  °Usually symptoms of pain and itching begin a few days to a week after contact. It first appears as small blisters that progress to small painful ulcers which then scab over and heal after several days. It affects the outer genitalia, birth canal, cervix, penis, anal area, buttocks, and thighs. °HOME CARE INSTRUCTIONS  °· Keep ulcerated areas dry and clean. °· Take medications as directed. Antiviral medications can speed up healing. They will not prevent recurrences or cure this infection. These medications can also be taken for suppression if there are frequent recurrences. °· While the infection is active, it is contagious. Avoid all sexual contact during active infections. °· Condoms may help prevent spread of the herpes virus. °· Practice safe sex. °· Wash your hands thoroughly after touching the genital area. °· Avoid touching your eyes after touching your genital area. °· Inform your caregiver if you have had genital herpes and become pregnant. It is your responsibility to insure a safe outcome for your baby in this pregnancy. °· Only take over-the-counter or prescription medicines for pain, discomfort, or fever as directed by your caregiver. °SEEK MEDICAL CARE IF:  °· You have a recurrence of this infection. °· You do not respond to medications and are not  improving. °· You have new sources of pain or discharge which have changed from the original infection. °· You have an oral temperature above 102° F (38.9° C). °· You develop abdominal pain. °· You develop eye pain or signs of eye infection. °Document Released: 02/24/2000 Document Revised: 05/21/2011 Document Reviewed: 03/16/2009 °ExitCare® Patient Information ©2015 [employer]. This information is not intended to replace advice given to you by your health care provider. Make sure you discuss any questions you have with your health care provider. ° °

## 2014-11-25 NOTE — Progress Notes (Signed)
Subjective:  Patient ID: Tyler Vasquez, male    DOB: Jan 27, 1951  Age: 64 y.o. MRN: [id number]  CC: Follow-up   HPI Tyler Vasquez presents  patient comes in with his wife who is alarmed by his recent lab work with a positive type II general herpes test. Before they married 3 years ago they both underwent STD testing and were negative. The wife is concerned that she is at risk of general herpes because of her husband's positive test. They don't think the testing done 3 years ago included a herpes test. The event the patient has never experienced an outbreak of herpes to his recollection and is not visibly infected currently. He's not had any extramarital sex last 3 years  History Tyler Vasquez has a past medical history of Hypertension; Hyperlipidemia; Hep C w/o coma, chronic (1999); Syphili, latent (1999); and PPD positive (1999).   He has no past surgical history on file.   His  family history includes Alcohol abuse in his father and other; Hypertension in his mother and other. There is no history of Cancer, Diabetes, Early death, Heart disease, Hyperlipidemia, Kidney disease, or Stroke.  He   reports that he has quit smoking. He has never used smokeless tobacco. He reports that he does not drink alcohol or use illicit drugs.  Outpatient Prescriptions Prior to Visit  Medication Sig Dispense Refill  . Alprostadil (PROSTAGLANDIN E1) POWD 40 mcg/mL by Intracavernosal route daily as needed (erectile dysfunction). May use up to 1 ml (40 mcg)/dose.    . losartan-hydrochlorothiazide (HYZAAR) 100-12.5 MG per tablet Take 1 tablet by mouth daily. 90 tablet 3  . Multiple Vitamin (MULTIVITAMIN WITH MINERALS) TABS tablet Take 1 tablet by mouth daily.     No facility-administered medications prior to visit.    Social History   Social History  . Marital Status: Single    Spouse Name: N/A  . Number of Children: N/A  . Years of Education: N/A   Social History Main Topics  . Smoking status:  Former Games developer  . Smokeless tobacco: Never Used  . Alcohol Use: No  . Drug Use: No  . Sexual Activity: Yes   Other Topics Concern  . None   Social History Narrative   Just released from prison, served a 16 year term for vehicular manslaughter     Review of Systems  Constitutional: Negative for fever, chills and appetite change.  HENT: Negative for congestion, ear pain, postnasal drip, sinus pressure and sore throat.   Eyes: Negative for pain and redness.  Respiratory: Negative for cough, shortness of breath and wheezing.   Cardiovascular: Negative for leg swelling.  Gastrointestinal: Negative for nausea, vomiting, abdominal pain, diarrhea, constipation and blood in stool.  Endocrine: Negative for polyuria.  Genitourinary: Negative for dysuria, urgency, frequency and flank pain.  Musculoskeletal: Negative for gait problem.  Skin: Negative for rash.  Neurological: Negative for weakness and headaches.  Psychiatric/Behavioral: Negative for confusion and decreased concentration. The patient is not nervous/anxious.     Objective:  BP 134/82 mmHg  Pulse 70  Temp(Src) 98.4 F (36.9 C)  Resp 18  Ht 5' 7.5" (1.715 m)  Wt 187 lb (84.823 kg)  BMI 28.84 kg/m2  SpO2 98%  Physical Exam  Constitutional: He is oriented to person, place, and time. He appears well-developed and well-nourished.  HENT:  Head: Normocephalic and atraumatic.  Eyes: Conjunctivae are normal. Pupils are equal, round, and reactive to light.  Pulmonary/Chest: Effort normal.  Musculoskeletal: He exhibits no edema.  Neurological:  He is alert and oriented to person, place, and time.  Skin: Skin is dry.  Psychiatric: He has a normal mood and affect. His behavior is normal. Thought content normal.      Assessment & Plan:   Tyler Vasquez was seen today for follow-up.  Diagnoses and all orders for this visit:  Genital herpes   I am having Tyler Vasquez maintain his multivitamin with minerals, Prostaglandin E1, and  losartan-hydrochlorothiazide.  No orders of the defined types were placed in this encounter.   I reassured them that this infection could've happened long ago before they were married in this this did not represent a confirmation infidelity the part of the patient. I given several handouts  Appropriate red flag conditions were discussed with the patient as well as actions that should be taken.  Patient expressed his understanding.  Follow-up: Return if symptoms worsen or fail to improve.  Carmelina Dane, MD

## 2014-11-25 NOTE — Telephone Encounter (Signed)
Spoke with patient gave him message he states that him and his wife will come into our walk in clinic to get more knowledge about positive lab result

## 2014-11-26 NOTE — Telephone Encounter (Signed)
Pt came in yest and saw Dr. Dareen Piano who discussed w/ him

## 2014-12-05 ENCOUNTER — Ambulatory Visit (INDEPENDENT_AMBULATORY_CARE_PROVIDER_SITE_OTHER): Payer: BLUE CROSS/BLUE SHIELD | Admitting: Family Medicine

## 2014-12-05 ENCOUNTER — Other Ambulatory Visit: Payer: Self-pay | Admitting: Radiology

## 2014-12-05 VITALS — BP 120/64 | HR 71 | Temp 97.6°F | Resp 14 | Ht 67.5 in | Wt 188.0 lb

## 2014-12-05 DIAGNOSIS — J209 Acute bronchitis, unspecified: Secondary | ICD-10-CM

## 2014-12-05 DIAGNOSIS — J069 Acute upper respiratory infection, unspecified: Secondary | ICD-10-CM

## 2014-12-05 MED ORDER — GUAIFENESIN ER 1200 MG PO TB12: 1.0000 | ORAL_TABLET | Freq: Two times a day (BID) | ORAL | Status: DC | PRN

## 2014-12-05 MED ORDER — BENZONATATE 200 MG PO CAPS: 200.0000 mg | ORAL_CAPSULE | Freq: Three times a day (TID) | ORAL | Status: DC | PRN

## 2014-12-05 MED ORDER — AZITHROMYCIN 250 MG PO TABS: ORAL_TABLET | ORAL | Status: DC

## 2014-12-05 MED ORDER — HYDROCOD POLST-CPM POLST ER 10-8 MG/5ML PO SUER: 5.0000 mL | Freq: Every evening | ORAL | Status: DC | PRN

## 2014-12-05 NOTE — Progress Notes (Signed)
Subjective:    Patient ID: Tyler Vasquez, male    DOB: 1950-08-28, 64 y.o.   MRN: [id number] This chart was scribed for [name], MD by Littie Deeds, Medical Scribe. This patient was seen in Room 12 and the patient's care was started at 8:47 AM.   Chief Complaint  Patient presents with  . Cough    x 1 week, productive yellowish mucus  . Nasal Congestion    HPI HPI Comments: Tyler Vasquez is a 64 y.o. male who presents to the [facility] complaining of gradual onset productive cough of yellowish/white sputum that started about a week ago. Patient also reports having associated congestion. He notes that he has difficulty getting a "good" cough to clear the mucus from his throat. He has tried Robitussin DM but without relief. Patient denies chest pain and SOB.  I last saw him 2 weeks ago for a full physical. At that time, we did do complete STD testing due to his history of hepatitis C and elevated PSA which is being followed by urology. 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 But he saw one of my colleagues and was assured this was subclinical.  Past Medical History  Diagnosis Date  . Hypertension   . Hyperlipidemia   . Hep C w/o coma, chronic 1999  . Syphili, latent 1999  . PPD positive 1999   Current Outpatient Prescriptions on File Prior to Visit  Medication Sig Dispense Refill  . Alprostadil (PROSTAGLANDIN E1) POWD 40 mcg/mL by Intracavernosal route daily as needed (erectile dysfunction). May use up to 1 ml (40 mcg)/dose.    . losartan-hydrochlorothiazide (HYZAAR) 100-12.5 MG per tablet Take 1 tablet by mouth daily. 90 tablet 3  . Multiple Vitamin (MULTIVITAMIN WITH MINERALS) TABS tablet Take 1 tablet by mouth daily.     No current facility-administered medications on file prior to visit.   Allergies  Allergen Reactions  . Amlodipine     ed     Review of Systems  Constitutional: Positive  for activity change, appetite change and fatigue. Negative for fever, chills and diaphoresis.  HENT: Positive for congestion, rhinorrhea and sinus pressure. Negative for sneezing, sore throat and trouble swallowing.   Respiratory: Positive for cough. Negative for chest tightness, shortness of breath and wheezing.   Cardiovascular: Negative for chest pain, palpitations and leg swelling.  Gastrointestinal: Negative for nausea, vomiting, abdominal pain, diarrhea, constipation and abdominal distention.  Genitourinary: Negative for hematuria and genital sores.  Skin: Negative for rash.  Neurological: Positive for headaches.  Hematological: Negative for adenopathy.       Objective:  BP 120/64 mmHg  Pulse 71  Temp(Src) 97.6 F (36.4 C) (Oral)  Resp 14  Ht 5' 7.5" (1.715 m)  Wt 188 lb (85.276 kg)  BMI 28.99 kg/m2  SpO2 96%  Physical Exam  Constitutional: He is oriented to person, place, and time. He appears well-developed and well-nourished. No distress.  HENT:  Head: Normocephalic and atraumatic.  Right Ear: Tympanic membrane normal.  Left Ear: Tympanic membrane normal.  Nose: Mucosal edema and rhinorrhea present.  Mouth/Throat: Oropharynx is clear and moist. No oropharyngeal exudate, posterior oropharyngeal edema, posterior oropharyngeal erythema or tonsillar abscesses.  Nasal mucosal edema with purulent rhinorrhea.   Eyes: Pupils are equal, round, and reactive to light.  Neck: Neck supple. No thyromegaly present.  Thyroid normal.  Cardiovascular: Normal rate, regular rhythm, S1 normal, S2 normal and normal  heart sounds.   Pulmonary/Chest: Effort normal and breath sounds normal. No respiratory distress. He has no wheezes. He has no rales.  Lungs are clear, but shortened expiratory phase.  Musculoskeletal: He exhibits no edema.  Lymphadenopathy:       Head (right side): No submandibular and no tonsillar adenopathy present.       Head (left side): No submandibular and no tonsillar  adenopathy present.    He has no cervical adenopathy.       Right: No supraclavicular adenopathy present.       Left: No supraclavicular adenopathy present.  Neurological: He is alert and oriented to person, place, and time. No cranial nerve deficit.  Skin: Skin is warm and dry. No rash noted.  Psychiatric: He has a normal mood and affect. His behavior is normal.  Nursing note and vitals reviewed.         Assessment & Plan:   1. URI, acute   2. Acute bronchitis, unspecified organism     Meds ordered this encounter  Medications  . chlorpheniramine-HYDROcodone (TUSSIONEX PENNKINETIC ER) 10-8 MG/5ML SUER    Sig: Take 5 mLs by mouth at bedtime as needed for cough.    Dispense:  120 mL    Refill:  0  . benzonatate (TESSALON) 200 MG capsule    Sig: Take 1 capsule (200 mg total) by mouth 3 (three) times daily as needed for cough.    Dispense:  60 capsule    Refill:  0  . DISCONTD: azithromycin (ZITHROMAX) 250 MG tablet    Sig: Take 2 tabs PO x 1 dose, then 1 tab PO QD x 4 days    Dispense:  6 tablet    Refill:  0  . Guaifenesin (MUCINEX MAXIMUM STRENGTH) 1200 MG TB12    Sig: Take 1 tablet (1,200 mg total) by mouth every 12 (twelve) hours as needed.    Dispense:  14 tablet    Refill:  1  . DISCONTD: azithromycin (ZITHROMAX) 250 MG tablet    Sig: Take 2 tabs PO x 1 dose, then 1 tab PO QD x 4 days    Dispense:  6 tablet    Refill:  0    I personally performed the services described in this documentation, which was scribed in my presence. The recorded information has been reviewed and considered, and addended by me as needed.  [name], MD MPH   By signing my name below, I, Littie Deeds, attest that this documentation has been prepared under the direction and in the presence of [name], MD.  Electronically Signed: Littie Deeds, Medical Scribe. 12/05/2014. 8:53 AM.

## 2014-12-05 NOTE — Patient Instructions (Signed)

## 2014-12-05 NOTE — Telephone Encounter (Signed)
I called patient he is having trouble getting Z pack at pharmacy I hav

## 2015-04-01 ENCOUNTER — Encounter: Payer: Self-pay | Admitting: Urology

## 2015-04-01 DIAGNOSIS — R897 Abnormal histological findings in specimens from other organs, systems and tissues: Secondary | ICD-10-CM

## 2015-04-01 HISTORY — DX: Abnormal histological findings in specimens from other organs, systems and tissues: R89.7

## 2015-05-11 NOTE — Progress Notes (Signed)
Subjective:    Patient ID: Tyler Vasquez, male    DOB: 26-Feb-1951, 65 y.o.   MRN: [id number] Chief Complaint  Patient presents with  . Hypertension  . Follow-up    HPI  New diagnosis of HSV2 made  incidentally made at CPE 6 mos prior. This was a routine screening test and pt  Is here today for repeat labs.  Does have a chronically low elevation of PSA and has seen urology years prior and has no desire to have this evalated further as he had a bengin biopsy and psa has been stable around 8 for years.  He did have a prostate biopsy and the pathology came back benign.  HTN: started hyzaar at last visit 6 mos prior.  BP at home is 140 is systolic.  Taking it every night but he is   H/o Hep C with unditectable viral load.  C/o occ dry cough occ during allergy season.  No chronic cough.  He gets more congestion with season change or cold air and cough tends to be productive. Responds well to mucinex and tessalon, request refill for tessalon and tussionex.  Past Medical History  Diagnosis Date  . Hypertension   . Hyperlipidemia   . Hep C w/o coma, chronic (HCC) 1999  . Syphili, latent 1999  . PPD positive 1999   No past surgical history on file. Current Outpatient Prescriptions on File Prior to Visit  Medication Sig Dispense Refill  . Alprostadil (PROSTAGLANDIN E1) POWD 40 mcg/mL by Intracavernosal route daily as needed (erectile dysfunction). May use up to 1 ml (40 mcg)/dose.    . Guaifenesin (MUCINEX MAXIMUM STRENGTH) 1200 MG TB12 Take 1 tablet (1,200 mg total) by mouth every 12 (twelve) hours as needed. 14 tablet 1  . Multiple Vitamin (MULTIVITAMIN WITH MINERALS) TABS tablet Take 1 tablet by mouth daily.     No current facility-administered medications on file prior to visit.   Allergies  Allergen Reactions  . Amlodipine     ed   Family History  Problem Relation Age of Onset  . Alcohol abuse Other   . Hypertension Other   . Hypertension Mother   . Alcohol abuse Father     . Cancer Neg Hx   . Diabetes Neg Hx   . Early death Neg Hx   . Heart disease Neg Hx   . Hyperlipidemia Neg Hx   . Kidney disease Neg Hx   . Stroke Neg Hx    Social History   Social History  . Marital Status: Single    Spouse Name: N/A  . Number of Children: N/A  . Years of Education: N/A   Social History Main Topics  . Smoking status: Former Games developer  . Smokeless tobacco: Never Used  . Alcohol Use: No  . Drug Use: No  . Sexual Activity: Yes   Other Topics Concern  . None   Social History Narrative   Just released from prison, served a 16 year term for vehicular manslaughter     Review of Systems  Constitutional: Negative for fever, chills, activity change and appetite change.  HENT: Positive for congestion, postnasal drip and rhinorrhea.   Eyes: Negative for visual disturbance.  Respiratory: Positive for cough. Negative for shortness of breath.   Cardiovascular: Negative for chest pain and leg swelling.  Endocrine: Positive for cold intolerance.  Genitourinary: Negative for dysuria, hematuria and genital sores.  Allergic/Immunologic: Positive for environmental allergies. Negative for immunocompromised state.  Neurological: Negative for dizziness, syncope, facial  asymmetry, weakness, light-headedness and headaches.       Objective:  BP 169/97 mmHg  Pulse 60  Temp(Src) 98 F (36.7 C)  Resp 16  Ht  (1.753 m)  Wt 188 lb (85.276 kg)  BMI 27.75 kg/m2  Physical Exam  Constitutional: He is oriented to person, place, and time. He appears well-developed and well-nourished. No distress.  HENT:  Head: Normocephalic and atraumatic.  Right Ear: Tympanic membrane, external ear and ear canal normal.  Left Ear: Tympanic membrane, external ear and ear canal normal.  Nose: Nose normal.  Mouth/Throat: Oropharynx is clear and moist and mucous membranes are normal. No oropharyngeal exudate.  Eyes: Conjunctivae are normal. No scleral icterus.  Neck: Normal range of motion.  Neck supple. No thyromegaly present.  Cardiovascular: Normal rate, regular rhythm, normal heart sounds and intact distal pulses.   Pulmonary/Chest: Effort normal and breath sounds normal. No respiratory distress.  Abdominal: Soft. Bowel sounds are normal. He exhibits no distension and no mass. There is no tenderness. There is no rebound and no guarding.  Musculoskeletal: He exhibits no edema.  Lymphadenopathy:    He has no cervical adenopathy.  Neurological: He is alert and oriented to person, place, and time.  Skin: Skin is warm and dry. He is not diaphoretic. No erythema.  Psychiatric: He has a normal mood and affect. His behavior is normal.          Assessment & Plan:  Flp.  cmp since started hyzaar and h/o hep C Increase hctz from 12. 56 to 25, cont losartan. Cough is occ and productive from seasonal.  1. Essential hypertension, benign   2. Pure hypercholesterolemia   3. Medication monitoring encounter     Orders Placed This Encounter  Procedures  . Comprehensive metabolic panel    Order Specific Question:  Has the patient fasted?    Answer:  Yes  . Lipid panel    Order Specific Question:  Has the patient fasted?    Answer:  Yes    Meds ordered this encounter  Medications  . losartan-hydrochlorothiazide (HYZAAR) 100-25 MG tablet    Sig: Take 1 tablet by mouth daily.    Dispense:  90 tablet    Refill:  3  . benzonatate (TESSALON) 200 MG capsule    Sig: Take 1 capsule (200 mg total) by mouth 3 (three) times daily as needed for cough.    Dispense:  60 capsule    Refill:  0  . chlorpheniramine-HYDROcodone (TUSSIONEX PENNKINETIC ER) 10-8 MG/5ML SUER    Sig: Take 5 mLs by mouth at bedtime as needed for cough.    Dispense:  120 mL    Refill:  0    [name], MD MPH

## 2015-05-12 ENCOUNTER — Encounter: Payer: Self-pay | Admitting: Family Medicine

## 2015-05-12 ENCOUNTER — Ambulatory Visit (INDEPENDENT_AMBULATORY_CARE_PROVIDER_SITE_OTHER): Payer: BLUE CROSS/BLUE SHIELD | Admitting: Family Medicine

## 2015-05-12 VITALS — BP 130/90 | HR 60 | Temp 98.0°F | Resp 16 | Ht 69.0 in | Wt 188.0 lb

## 2015-05-12 DIAGNOSIS — I10 Essential (primary) hypertension: Secondary | ICD-10-CM | POA: Diagnosis not present

## 2015-05-12 DIAGNOSIS — Z5181 Encounter for therapeutic drug level monitoring: Secondary | ICD-10-CM

## 2015-05-12 DIAGNOSIS — E7800 Pure hypercholesterolemia, unspecified: Secondary | ICD-10-CM

## 2015-05-12 LAB — COMPREHENSIVE METABOLIC PANEL
ALBUMIN: 3.9 g/dL (ref 3.6–5.1)
ALK PHOS: 70 U/L (ref 40–115)
ALT: 26 U/L (ref 9–46)
AST: 22 U/L (ref 10–35)
BILIRUBIN TOTAL: 0.6 mg/dL (ref 0.2–1.2)
BUN: 15 mg/dL (ref 7–25)
CALCIUM: 9.1 mg/dL (ref 8.6–10.3)
CO2: 28 mmol/L (ref 20–31)
CREATININE: 1.32 mg/dL — AB (ref 0.70–1.25)
Chloride: 103 mmol/L (ref 98–110)
Glucose, Bld: 99 mg/dL (ref 65–99)
Potassium: 4.1 mmol/L (ref 3.5–5.3)
SODIUM: 138 mmol/L (ref 135–146)
TOTAL PROTEIN: 6.8 g/dL (ref 6.1–8.1)

## 2015-05-12 LAB — LIPID PANEL
Cholesterol: 242 mg/dL — ABNORMAL HIGH (ref 125–200)
HDL: 39 mg/dL — AB (ref >=40)
TRIGLYCERIDES: 404 mg/dL — AB (ref <150)
Total CHOL/HDL Ratio: 6.2 Ratio — ABNORMAL HIGH (ref <=5.0)

## 2015-05-12 MED ORDER — BENZONATATE 200 MG PO CAPS: 200.0000 mg | ORAL_CAPSULE | Freq: Three times a day (TID) | ORAL | Status: DC | PRN

## 2015-05-12 MED ORDER — LOSARTAN POTASSIUM-HCTZ 100-25 MG PO TABS: 1.0000 | ORAL_TABLET | Freq: Every day | ORAL | Status: DC

## 2015-05-12 MED ORDER — HYDROCOD POLST-CPM POLST ER 10-8 MG/5ML PO SUER: 5.0000 mL | Freq: Every evening | ORAL | Status: DC | PRN

## 2015-05-12 NOTE — Patient Instructions (Signed)
DASH Eating Plan DASH stands for "Dietary Approaches to Stop Hypertension." The DASH eating plan is a healthy eating plan that has been shown to reduce high blood pressure (hypertension). Additional health benefits may include reducing the risk of type 2 diabetes mellitus, heart disease, and stroke. The DASH eating plan may also help with weight loss. WHAT DO I NEED TO KNOW ABOUT THE DASH EATING PLAN? For the DASH eating plan, you will follow these general guidelines:  Choose foods with a percent daily value for sodium of less than 5% (as listed on the food label).  Use salt-free seasonings or herbs instead of table salt or sea salt.  Check with your health care provider or pharmacist before using salt substitutes.  Eat lower-sodium products, often labeled as "lower sodium" or "no salt added."  Eat fresh foods.  Eat more vegetables, fruits, and low-fat dairy products.  Choose whole grains. Look for the word "whole" as the first word in the ingredient list.  Choose fish and skinless chicken or turkey more often than red meat. Limit fish, poultry, and meat to 6 oz (170 g) each day.  Limit sweets, desserts, sugars, and sugary drinks.  Choose heart-healthy fats.  Limit cheese to 1 oz (28 g) per day.  Eat more home-cooked food and less restaurant, buffet, and fast food.  Limit fried foods.  Cook foods using methods other than frying.  Limit canned vegetables. If you do use them, rinse them well to decrease the sodium.  When eating at a restaurant, ask that your food be prepared with less salt, or no salt if possible. WHAT FOODS CAN I EAT? Seek help from a dietitian for individual calorie needs. Grains Whole grain or whole wheat bread. Brown rice. Whole grain or whole wheat pasta. Quinoa, bulgur, and whole grain cereals. Low-sodium cereals. Corn or whole wheat flour tortillas. Whole grain cornbread. Whole grain crackers. Low-sodium crackers. Vegetables Fresh or frozen vegetables  (raw, steamed, roasted, or grilled). Low-sodium or reduced-sodium tomato and vegetable juices. Low-sodium or reduced-sodium tomato sauce and paste. Low-sodium or reduced-sodium canned vegetables.  Fruits All fresh, canned (in natural juice), or frozen fruits. Meat and Other Protein Products Ground beef (85% or leaner), grass-fed beef, or beef trimmed of fat. Skinless chicken or turkey. Ground chicken or turkey. Pork trimmed of fat. All fish and seafood. Eggs. Dried beans, peas, or lentils. Unsalted nuts and seeds. Unsalted canned beans. Dairy Low-fat dairy products, such as skim or 1% milk, 2% or reduced-fat cheeses, low-fat ricotta or cottage cheese, or plain low-fat yogurt. Low-sodium or reduced-sodium cheeses. Fats and Oils Tub margarines without trans fats. Light or reduced-fat mayonnaise and salad dressings (reduced sodium). Avocado. Safflower, olive, or canola oils. Natural peanut or almond butter. Other Unsalted popcorn and pretzels. The items listed above may not be a complete list of recommended foods or beverages. Contact your dietitian for more options. WHAT FOODS ARE NOT RECOMMENDED? Grains White bread. White pasta. White rice. Refined cornbread. Bagels and croissants. Crackers that contain trans fat. Vegetables Creamed or fried vegetables. Vegetables in a cheese sauce. Regular canned vegetables. Regular canned tomato sauce and paste. Regular tomato and vegetable juices. Fruits Dried fruits. Canned fruit in light or heavy syrup. Fruit juice. Meat and Other Protein Products Fatty cuts of meat. Ribs, chicken wings, bacon, sausage, bologna, salami, chitterlings, fatback, hot dogs, bratwurst, and packaged luncheon meats. Salted nuts and seeds. Canned beans with salt. Dairy Whole or 2% milk, cream, half-and-half, and cream cheese. Whole-fat or sweetened yogurt. Full-fat   cheeses or blue cheese. Nondairy creamers and whipped toppings. Processed cheese, cheese spreads, or cheese  curds. Condiments Onion and garlic salt, seasoned salt, table salt, and sea salt. Canned and packaged gravies. Worcestershire sauce. Tartar sauce. Barbecue sauce. Teriyaki sauce. Soy sauce, including reduced sodium. Steak sauce. Fish sauce. Oyster sauce. Cocktail sauce. Horseradish. Ketchup and mustard. Meat flavorings and tenderizers. Bouillon cubes. Hot sauce. Tabasco sauce. Marinades. Taco seasonings. Relishes. Fats and Oils Butter, stick margarine, lard, shortening, ghee, and bacon fat. Coconut, palm kernel, or palm oils. Regular salad dressings. Other Pickles and olives. Salted popcorn and pretzels. The items listed above may not be a complete list of foods and beverages to avoid. Contact your dietitian for more information. WHERE CAN I FIND MORE INFORMATION? National Heart, Lung, and Blood Institute: www.nhlbi.nih.gov/health/health-topics/topics/dash/   This information is not intended to replace advice given to you by your health care provider. Make sure you discuss any questions you have with your health care provider.   Document Released: 02/15/2011 Document Revised: 03/19/2014 Document Reviewed: 12/31/2012 Elsevier Interactive Patient Education 2016 Else[employer] Inc.  Managing Your High Blood Pressure Blood pressure is a measurement of how forceful your blood is pressing against the walls of the arteries. Arteries are muscular tubes within the circulatory system. Blood pressure does not stay the same. Blood pressure rises when you are active, excited, or nervous; and it lowers during sleep and relaxation. If the numbers measuring your blood pressure stay above normal most of the time, you are at risk for health problems. High blood pressure (hypertension) is a long-term (chronic) condition in which blood pressure is elevated. A blood pressure reading is recorded as two numbers, such as 120 over 80 (or 120/80). The first, higher number is called the systolic pressure. It is a measure of the  pressure in your arteries as the heart beats. The second, lower number is called the diastolic pressure. It is a measure of the pressure in your arteries as the heart relaxes between beats.  Keeping your blood pressure in a normal range is important to your overall health and prevention of health problems, such as heart disease and stroke. When your blood pressure is uncontrolled, your heart has to work harder than normal. High blood pressure is a very common condition in adults because blood pressure tends to rise with age. Men and women are equally likely to have hypertension but at different times in life. Before age 45, men are more likely to have hypertension. After 65 years of age, women are more likely to have it. Hypertension is especially common in African Americans. This condition often has no signs or symptoms. The cause of the condition is usually not known. Your caregiver can help you come up with a plan to keep your blood pressure in a normal, healthy range. BLOOD PRESSURE STAGES Blood pressure is classified into four stages: normal, prehypertension, stage 1, and stage 2. Your blood pressure reading will be used to determine what type of treatment, if any, is necessary. Appropriate treatment options are tied to these four stages:  Normal  Systolic pressure (mm Hg): below 120.  Diastolic pressure (mm Hg): below 80. Prehypertension  Systolic pressure (mm Hg): 120 to 139.  Diastolic pressure (mm Hg): 80 to 89. Stage1  Systolic pressure (mm Hg): 140 to 159.  Diastolic pressure (mm Hg): 90 to 99. Stage2  Systolic pressure (mm Hg): 160 or above.  Diastolic pressure (mm Hg): 100 or above. RISKS RELATED TO HIGH BLOOD PRESSURE Managing your blood   pressure is an important responsibility. Uncontrolled high blood pressure can lead to:  A heart attack.  A stroke.  A weakened blood vessel (aneurysm).  Heart failure.  Kidney damage.  Eye damage.  Metabolic syndrome.  Memory  and concentration problems. HOW TO MANAGE YOUR BLOOD PRESSURE Blood pressure can be managed effectively with lifestyle changes and medicines (if needed). Your caregiver will help you come up with a plan to bring your blood pressure within a normal range. Your plan should include the following: Education  Read all information provided by your caregivers about how to control blood pressure.  Educate yourself on the latest guidelines and treatment recommendations. New research is always being done to further define the risks and treatments for high blood pressure. Lifestylechanges  Control your weight.  Avoid smoking.  Stay physically active.  Reduce the amount of salt in your diet.  Reduce stress.  Control any chronic conditions, such as high cholesterol or diabetes.  Reduce your alcohol intake. Medicines  Several medicines (antihypertensive medicines) are available, if needed, to bring blood pressure within a normal range. Communication  Review all the medicines you take with your caregiver because there may be side effects or interactions.  Talk with your caregiver about your diet, exercise habits, and other lifestyle factors that may be contributing to high blood pressure.  See your caregiver regularly. Your caregiver can help you create and adjust your plan for managing high blood pressure. RECOMMENDATIONS FOR TREATMENT AND FOLLOW-UP  The following recommendations are based on current guidelines for managing high blood pressure in nonpregnant adults. Use these recommendations to identify the proper follow-up period or treatment option based on your blood pressure reading. You can discuss these options with your caregiver.  Systolic pressure of 120 to 139 or diastolic pressure of 80 to 89: Follow up with your caregiver as directed.  Systolic pressure of 140 to 160 or diastolic pressure of 90 to 100: Follow up with your caregiver within 2 months.  Systolic pressure above 160  or diastolic pressure above 100: Follow up with your caregiver within 1 month.  Systolic pressure above 180 or diastolic pressure above 110: Consider antihypertensive therapy; follow up with your caregiver within 1 week.  Systolic pressure above 200 or diastolic pressure above 120: Begin antihypertensive therapy; follow up with your caregiver within 1 week.   This information is not intended to replace advice given to you by your health care provider. Make sure you discuss any questions you have with your health care provider.   Document Released: 11/21/2011 Document Reviewed: 11/21/2011 Elsevier Interactive Patient Education 2016 Else[employer] Inc.  

## 2015-09-23 DIAGNOSIS — R972 Elevated prostate specific antigen [PSA]: Secondary | ICD-10-CM | POA: Diagnosis not present

## 2015-09-23 DIAGNOSIS — N5201 Erectile dysfunction due to arterial insufficiency: Secondary | ICD-10-CM | POA: Diagnosis not present

## 2015-11-24 ENCOUNTER — Ambulatory Visit (INDEPENDENT_AMBULATORY_CARE_PROVIDER_SITE_OTHER): Payer: BLUE CROSS/BLUE SHIELD | Admitting: Family Medicine

## 2015-11-24 ENCOUNTER — Encounter: Payer: Self-pay | Admitting: Family Medicine

## 2015-11-24 VITALS — BP 120/70 | HR 78 | Temp 97.8°F | Resp 16 | Ht 67.0 in | Wt 190.8 lb

## 2015-11-24 DIAGNOSIS — B182 Chronic viral hepatitis C: Secondary | ICD-10-CM | POA: Diagnosis not present

## 2015-11-24 DIAGNOSIS — Z1212 Encounter for screening for malignant neoplasm of rectum: Secondary | ICD-10-CM

## 2015-11-24 DIAGNOSIS — Z130 Encounter for screening for diseases of the blood and blood-forming organs and certain disorders involving the immune mechanism: Secondary | ICD-10-CM | POA: Diagnosis not present

## 2015-11-24 DIAGNOSIS — Z0000 Encounter for general adult medical examination without abnormal findings: Secondary | ICD-10-CM | POA: Diagnosis not present

## 2015-11-24 DIAGNOSIS — Z136 Encounter for screening for cardiovascular disorders: Secondary | ICD-10-CM | POA: Diagnosis not present

## 2015-11-24 DIAGNOSIS — I10 Essential (primary) hypertension: Secondary | ICD-10-CM | POA: Diagnosis not present

## 2015-11-24 DIAGNOSIS — Z125 Encounter for screening for malignant neoplasm of prostate: Secondary | ICD-10-CM

## 2015-11-24 DIAGNOSIS — Z1383 Encounter for screening for respiratory disorder NEC: Secondary | ICD-10-CM

## 2015-11-24 DIAGNOSIS — E7800 Pure hypercholesterolemia, unspecified: Secondary | ICD-10-CM | POA: Diagnosis not present

## 2015-11-24 DIAGNOSIS — Z1211 Encounter for screening for malignant neoplasm of colon: Secondary | ICD-10-CM | POA: Diagnosis not present

## 2015-11-24 DIAGNOSIS — Z87891 Personal history of nicotine dependence: Secondary | ICD-10-CM

## 2015-11-24 DIAGNOSIS — Z1329 Encounter for screening for other suspected endocrine disorder: Secondary | ICD-10-CM | POA: Diagnosis not present

## 2015-11-24 DIAGNOSIS — Z5181 Encounter for therapeutic drug level monitoring: Secondary | ICD-10-CM

## 2015-11-24 DIAGNOSIS — R972 Elevated prostate specific antigen [PSA]: Secondary | ICD-10-CM

## 2015-11-24 DIAGNOSIS — Z23 Encounter for immunization: Secondary | ICD-10-CM | POA: Diagnosis not present

## 2015-11-24 DIAGNOSIS — Z1389 Encounter for screening for other disorder: Secondary | ICD-10-CM

## 2015-11-24 LAB — COMPREHENSIVE METABOLIC PANEL
ALBUMIN: 4 g/dL (ref 3.6–5.1)
ALK PHOS: 66 U/L (ref 40–115)
ALT: 23 U/L (ref 9–46)
AST: 27 U/L (ref 10–35)
BILIRUBIN TOTAL: 0.6 mg/dL (ref 0.2–1.2)
BUN: 14 mg/dL (ref 7–25)
CO2: 29 mmol/L (ref 20–31)
Calcium: 9.2 mg/dL (ref 8.6–10.3)
Chloride: 104 mmol/L (ref 98–110)
Creat: 1.25 mg/dL (ref 0.70–1.25)
GLUCOSE: 105 mg/dL — AB (ref 65–99)
POTASSIUM: 4.3 mmol/L (ref 3.5–5.3)
Sodium: 139 mmol/L (ref 135–146)
TOTAL PROTEIN: 7.1 g/dL (ref 6.1–8.1)

## 2015-11-24 LAB — CBC
HCT: 43.2 % (ref 38.5–50.0)
Hemoglobin: 15.5 g/dL (ref 13.2–17.1)
MCH: 32.2 pg (ref 27.0–33.0)
MCHC: 35.9 g/dL (ref 32.0–36.0)
MCV: 89.8 fL (ref 80.0–100.0)
MPV: 10.4 fL (ref 7.5–12.5)
PLATELETS: 308 10*3/uL (ref 140–400)
RBC: 4.81 MIL/uL (ref 4.20–5.80)
RDW: 13 % (ref 11.0–15.0)
WBC: 4.7 10*3/uL (ref 3.8–10.8)

## 2015-11-24 LAB — LIPID PANEL
CHOLESTEROL: 251 mg/dL — AB (ref 125–200)
HDL: 43 mg/dL (ref >=40)
LDL Cholesterol: 141 mg/dL — ABNORMAL HIGH (ref <130)
TRIGLYCERIDES: 334 mg/dL — AB (ref <150)
Total CHOL/HDL Ratio: 5.8 Ratio — ABNORMAL HIGH (ref <=5.0)
VLDL: 67 mg/dL — ABNORMAL HIGH (ref <30)

## 2015-11-24 LAB — POCT URINALYSIS DIP (MANUAL ENTRY)
BILIRUBIN UA: NEGATIVE
Bilirubin, UA: NEGATIVE
Glucose, UA: NEGATIVE
Leukocytes, UA: NEGATIVE
Nitrite, UA: NEGATIVE
PROTEIN UA: NEGATIVE
RBC UA: NEGATIVE
Spec Grav, UA: 1.015
Urobilinogen, UA: 0.2
pH, UA: 5.5

## 2015-11-24 LAB — POC MICROSCOPIC URINALYSIS (UMFC): MUCUS RE: ABSENT

## 2015-11-24 MED ORDER — ZOSTER VACCINE LIVE 19400 UNT/0.65ML ~~LOC~~ SUSR: 0.6500 mL | Freq: Once | SUBCUTANEOUS | 0 refills | Status: AC

## 2015-11-24 NOTE — Patient Instructions (Signed)
   IF you received an x-ray today, you will receive an invoice from [facility]. Please contact [facility] at [phone] with questions or concerns regarding your invoice.   IF you received labwork today, you will receive an invoice from [facility]. Please contact [facility] at [phone] with questions or concerns regarding your invoice.   Our billing staff will not be able to assist you with questions regarding bills from these companies.  You will be contacted with the lab results as soon as they are available. The fastest way to get your results is to activate your My Chart account. Instructions are located on the last page of this paperwork. If you have not heard from us regarding the results in 2 weeks, please contact this office.    Influenza (Flu) Vaccine (Inactivated or Recombinant):  1. Why get vaccinated? Influenza ("flu") is a contagious disease that spreads around the [place] every year, usually between October and May. Flu is caused by influenza viruses, and is spread mainly by coughing, sneezing, and close contact. Anyone can get flu. Flu strikes suddenly and can last several days. Symptoms vary by age, but can include:  fever/chills  sore throat  muscle aches  fatigue  cough  headache  runny or stuffy nose Flu can also lead to pneumonia and blood infections, and cause diarrhea and seizures in children. If you have a medical condition, such as heart or lung disease, flu can make it worse. Flu is more dangerous for some people. Infants and young children, people 65 years of age and older, pregnant women, and people with certain health conditions or a weakened immune system are at greatest risk. Each year thousands of people in the [place] die from flu, and many more are hospitalized. Flu vaccine can:  keep you from getting flu,  make flu less severe if you do get it, and  keep you from spreading flu to  your family and other people. 2. Inactivated and recombinant flu vaccines A dose of flu vaccine is recommended every flu season. Children 6 months through 8 years of age may need two doses during the same flu season. Everyone else needs only one dose each flu season. Some inactivated flu vaccines contain a very small amount of a mercury-based preservative called thimerosal. Studies have not shown thimerosal in vaccines to be harmful, but flu vaccines that do not contain thimerosal are available. There is no live flu virus in flu shots. They cannot cause the flu. There are many flu viruses, and they are always changing. Each year a new flu vaccine is made to protect against three or four viruses that are likely to cause disease in the upcoming flu season. But even when the vaccine doesn't exactly match these viruses, it may still provide some protection. Flu vaccine cannot prevent:  flu that is caused by a virus not covered by the vaccine, or  illnesses that look like flu but are not. It takes about 2 weeks for protection to develop after vaccination, and protection lasts through the flu season. 3. Some people should not get this vaccine Tell the person who is giving you the vaccine:  If you have any severe, life-threatening allergies. If you ever had a life-threatening allergic reaction after a dose of flu vaccine, or have a severe allergy to any part of this vaccine, you may be advised not to get vaccinated. Most, but not all, types of flu vaccine contain a small amount of egg protein.    If you ever had Guillain-Barre Syndrome (also called GBS). Some people with a history of GBS should not get this vaccine. This should be discussed with your doctor.  If you are not feeling well. It is usually okay to get flu vaccine when you have a mild illness, but you might be asked to come back when you feel better. 4. Risks of a vaccine reaction With any medicine, including vaccines, there is a chance of  reactions. These are usually mild and go away on their own, but serious reactions are also possible. Most people who get a flu shot do not have any problems with it. Minor problems following a flu shot include:  soreness, redness, or swelling where the shot was given  hoarseness  sore, red or itchy eyes  cough  fever  aches  headache  itching  fatigue If these problems occur, they usually begin soon after the shot and last 1 or 2 days. More serious problems following a flu shot can include the following:  There may be a small increased risk of Guillain-Barre Syndrome (GBS) after inactivated flu vaccine. This risk has been estimated at 1 or 2 additional cases per million people vaccinated. This is much lower than the risk of severe complications from flu, which can be prevented by flu vaccine.  Young children who get the flu shot along with pneumococcal vaccine (PCV13) and/or DTaP vaccine at the same time might be slightly more likely to have a seizure caused by fever. Ask your doctor for more information. Tell your doctor if a child who is getting flu vaccine has ever had a seizure. Problems that could happen after any injected vaccine:  People sometimes faint after a medical procedure, including vaccination. Sitting or lying down for about 15 minutes can help prevent fainting, and injuries caused by a fall. Tell your doctor if you feel dizzy, or have vision changes or ringing in the ears.  Some people get severe pain in the shoulder and have difficulty moving the arm where a shot was given. This happens very rarely.  Any medication can cause a severe allergic reaction. Such reactions from a vaccine are very rare, estimated at about 1 in a million doses, and would happen within a few minutes to a few hours after the vaccination. As with any medicine, there is a very remote chance of a vaccine causing a serious injury or death. The safety of vaccines is always being monitored. For  more information, visit: www.cdc.gov/vaccinesafety/ 5. What if there is a serious reaction? What should I look for?  Look for anything that concerns you, such as signs of a severe allergic reaction, very high fever, or unusual behavior. Signs of a severe allergic reaction can include hives, swelling of the face and throat, difficulty breathing, a fast heartbeat, dizziness, and weakness. These would start a few minutes to a few hours after the vaccination. What should I do?  If you think it is a severe allergic reaction or other emergency that can't wait, call 9-1-1 and get the person to the nearest hospital. Otherwise, call your doctor.  Reactions should be reported to the Vaccine Adverse Event Reporting System (VAERS). Your doctor should file this report, or you can do it yourself through the VAERS web site at www.vaers.hhs.gov, or by calling 1-800-822-7967. VAERS does not give medical advice. 6. The National Vaccine Injury Compensation Program The National Vaccine Injury Compensation Program (VICP) is a federal program that was created to compensate people who may have been   injured by certain vaccines. Persons who believe they may have been injured by a vaccine can learn about the program and about filing a claim by calling 1-800-338-2382 or visiting the VICP website at www.hrsa.gov/vaccinecompensation. There is a time limit to file a claim for compensation. 7. How can I learn more?  Ask your healthcare provider. He or she can give you the vaccine package insert or suggest other sources of information.  Call your local or state health department.  Contact the Centers for Disease Control and Prevention (CDC):  Call 1-800-232-4636 (1-800-CDC-INFO) or  Visit CDC's website at www.cdc.gov/flu Vaccine Information Statement Inactivated Influenza Vaccine (10/16/2013)   This information is not intended to replace advice given to you by your health care provider. Make sure you discuss any  questions you have with your health care provider.   Document Released: 12/21/2005 Document Revised: 03/19/2014 Document Reviewed: 10/19/2013 Elsevier Interactive Patient Education 2016 Elsevier Inc.  

## 2015-12-07 MED ORDER — PRAVASTATIN SODIUM 40 MG PO TABS: 40.0000 mg | ORAL_TABLET | Freq: Every day | ORAL | 0 refills | Status: DC

## 2015-12-07 NOTE — Progress Notes (Addendum)
Subjective:    Patient ID: Tyler Vasquez, male    DOB: 08-18-1950, 65 y.o.   MRN: [id number]  Chief Complaint  Patient presents with  . Annual Exam    flu shot    HPI Tyler Vasquez is a 65 y.o. male who presents to the [facility] for complete physical. 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  Primary preventative care: Diet exercise: Very active job - walking a lot, a lot of stairs.  Working on heart [employer].  Vitamin supplements: Has stopped men's multivitamin but might restart  Immunization: Tdap was in 2013.   CRS: Pt referred for initial screening colonoscopy, but did not keep referral. Prefers to do home stool test.  EKG done Sep 2009.   Chronic disease management:  HTN: Has been on hyzaar for the past year. Pt checks his BP at home regularly. Dose was increased at last visit 6 months prior. EGFR degreased from 78% to 66%.   HLD:  Cholesterol total of 242 6 months prior, non-HDL 203. LDL non calculable as triglycerides are 404. Creatinine of 1.32. Up from 1.15 a year prior. Direct LDL was 66 from year prior as well.   Hepatitis C: Undetectable viral load, so monitoring.  Chronically elevated PSA/ED: Pt has had a benign prostate biopsy and PSA has been stable for 9 years. So pt has refused to follow up with urology further. Has seen Dr Beatrix Fetters; urology in the past. ED only responds to prostaglandin.   He did have a prostate biopsy since his last visit. PSA was 9.5 ->7.5 on 09/23/15. Pt brought in his biopsy results that notes that some areas are atypical and suspicous for adenocarcinoma but there are insufficient cytologic / architectural atypia to establish a definitive diagnosis.  Pt also has HSV II.  Gets a little bit of phlegm in his throat o/n, no gerd, some PND  Depression screen [facility] 2/9 11/24/2015 05/12/2015 12/05/2014 11/25/2014 11/20/2014    Decreased Interest 0 0 0 0 0  Down, Depressed, Hopeless 0 0 0 0 0  PHQ - 2 Score 0 0 0 0 0   Past Medical History:  Diagnosis Date  . Hep C w/o coma, chronic ([place]) 1999  . Hyperlipidemia   . Hypertension   . PPD positive 1999  . Syphili, latent 1999   No past surgical history on file. Current Outpatient Prescriptions on File Prior to Visit  Medication Sig Dispense Refill  . Alprostadil (PROSTAGLANDIN E1) POWD 40 mcg/mL by Intracavernosal route daily as needed (erectile dysfunction). May use up to 1 ml (40 mcg)/dose.    . losartan-hydrochlorothiazide (HYZAAR) 100-25 MG tablet Take 1 tablet by mouth daily. 90 tablet 3  . Multiple Vitamin (MULTIVITAMIN WITH MINERALS) TABS tablet Take 1 tablet by mouth daily.     No current facility-administered medications on file prior to visit.    Allergies  Allergen Reactions  . Amlodipine     ed   Family History  Problem Relation Age of Onset  . Alcohol abuse Other   . Hypertension Other   . Hypertension Mother   . Alcohol abuse Father   . Cancer Neg Hx   . Diabetes Neg Hx   . Early death Neg Hx   . Heart disease Neg Hx   . Hyperlipidemia Neg Hx   . Kidney disease Neg Hx   . Stroke Neg Hx  Social History   Social History  . Marital status: Single    Spouse name: N/A  . Number of children: N/A  . Years of education: N/A   Social History Main Topics  . Smoking status: Former Research scientist (life sciences)  . Smokeless tobacco: Never Used  . Alcohol use No  . Drug use: No  . Sexual activity: Yes   Other Topics Concern  . None   Social History Narrative   Just released from prison, served a 16 year term for vehicular manslaughter     Review of Systems  Constitutional: Negative for activity change, appetite change and unexpected weight change.  HENT: Positive for postnasal drip. Negative for sore throat and trouble swallowing.   All other systems reviewed and are negative.      Objective:   Physical Exam  Constitutional: He is oriented  to person, place, and time. He appears well-developed and well-nourished. No distress.  HENT:  Head: Normocephalic and atraumatic.  Right Ear: Tympanic membrane, external ear and ear canal normal.  Left Ear: Tympanic membrane, external ear and ear canal normal.  Nose: Nose normal.  Mouth/Throat: Oropharynx is clear and moist and mucous membranes are normal. No oropharyngeal exudate.  Eyes: Conjunctivae are normal. No scleral icterus.  Neck: Normal range of motion. Neck supple. No thyromegaly present.  Cardiovascular: Normal rate, regular rhythm, normal heart sounds and intact distal pulses.   Pulmonary/Chest: Effort normal and breath sounds normal. No respiratory distress.  Abdominal: Soft. Bowel sounds are normal. He exhibits no distension and no mass. There is no tenderness. There is no rebound and no guarding.  Musculoskeletal: He exhibits no edema.  Lymphadenopathy:    He has no cervical adenopathy.  Neurological: He is alert and oriented to person, place, and time.  Skin: Skin is warm and dry. He is not diaphoretic. No erythema.  Psychiatric: He has a normal mood and affect. His behavior is normal.      BP 120/70 (BP Location: Left Arm, Patient Position: Sitting, Cuff Size: Normal)   Pulse 78   Temp 97.8 F (36.6 C) (Oral)   Resp 16   Ht '5\' 7"'  (1.702 m)   Wt 190 lb 12.8 oz (86.5 kg)   SpO2 97%   BMI 29.88 kg/m    Visual Acuity Screening   Right eye Left eye Both eyes  Without correction: '20/20 20/30 20/25 '  With correction:       Assessment & Plan:  Needs Prevnar, flu shot, zostavax - not sure if he has gotten this at [facility], thinks he might have. Ordered prevnar-13 today but looks like pt wasn't given it???? Reorder and give at next OV   Refer for cologuard per pt request  Need abd [place] to screen for AAA as he is a former smoker - ordered  Lipid, cmp, ua, cbc orders entered.   Need to start on chol med if flp is not sig improved today.   Start asa  Refill hyzaar  x 1 yr Anticipate starting statin after lipid comes back - informed pt to come in for lab only visit for lfts in 6-8 wks after starting statin then recheck in 6 mos for flp.  1. Welcome to Medicare preventive visit   2. Annual physical exam   3. Screening for cardiovascular, respiratory, and genitourinary diseases   4. Screening for colorectal cancer   5. Screening for deficiency anemia   6. Screening for prostate cancer   7. Screening for thyroid disorder   8. Essential hypertension,  benign   9. Hep C w/o coma, chronic (HCC)   10. Pure hypercholesterolemia   11. PSA elevation   12. Need for prophylactic vaccination against Streptococcus pneumoniae (pneumococcus)   13. History of tobacco use   14. Screening for AAA (abdominal aortic aneurysm)   15. Flu vaccine need     Orders Placed This Encounter  Procedures  . US ABDOMINAL AORTA SCREENING AAA    190 LBS/NO NEEDS INS BCBS [place] AND PT WITH EPIC ORDER    Standing Status:   Future    Standing Expiration Date:   01/22/2017    Order Specific Question:   Reason for Exam (SYMPTOM  OR DIAGNOSIS REQUIRED)    Answer:   screening for AAA, h/o tob use    Order Specific Question:   Preferred imaging location?    Answer:   [facility]  . Flu Vaccine QUAD 36+ mos IM  . Pneumococcal conjugate vaccine 13-valent IM  . CBC  . Comprehensive metabolic panel    Order Specific Question:   Has the patient fasted?    Answer:   Yes  . Lipid panel    Order Specific Question:   Has the patient fasted?    Answer:   Yes  . Cologuard  . POCT urinalysis dipstick  . POCT Microscopic Urinalysis ([facility])    Meds ordered this encounter  Medications  . Zoster Vaccine Live, PF, (ZOSTAVAX) 80034 UNT/0.65ML injection    Sig: Inject 19,400 Units into the skin once.    Dispense:  1 vial    Refill:  0  . pravastatin (PRAVACHOL) 40 MG tablet    Sig: Take 1 tablet (40 mg total) by mouth daily.    Dispense:  90 tablet    Refill:  0     Delman Cheadle,  M.D.  [facility] [address] [place], [place] [address] [phone] phone [phone] fax  12/07/15 1:05 PM

## 2016-03-16 ENCOUNTER — Ambulatory Visit (INDEPENDENT_AMBULATORY_CARE_PROVIDER_SITE_OTHER): Payer: BLUE CROSS/BLUE SHIELD | Admitting: Physician Assistant

## 2016-03-16 VITALS — BP 150/80 | HR 90 | Temp 99.2°F | Resp 18 | Ht 67.0 in | Wt 188.2 lb

## 2016-03-16 DIAGNOSIS — Z20828 Contact with and (suspected) exposure to other viral communicable diseases: Secondary | ICD-10-CM

## 2016-03-16 DIAGNOSIS — J111 Influenza due to unidentified influenza virus with other respiratory manifestations: Secondary | ICD-10-CM

## 2016-03-16 DIAGNOSIS — R69 Illness, unspecified: Secondary | ICD-10-CM | POA: Diagnosis not present

## 2016-03-16 DIAGNOSIS — R05 Cough: Secondary | ICD-10-CM

## 2016-03-16 DIAGNOSIS — R059 Cough, unspecified: Secondary | ICD-10-CM

## 2016-03-16 MED ORDER — OSELTAMIVIR PHOSPHATE 75 MG PO CAPS: 75.0000 mg | ORAL_CAPSULE | Freq: Two times a day (BID) | ORAL | 0 refills | Status: DC

## 2016-03-16 MED ORDER — BENZONATATE 200 MG PO CAPS: 200.0000 mg | ORAL_CAPSULE | Freq: Two times a day (BID) | ORAL | 0 refills | Status: DC | PRN

## 2016-03-16 NOTE — Patient Instructions (Addendum)
Take tamiflu as soon as possible.  Continue taking 600 mg Motrin every 8 hours for aches and fever.      IF you received an x-ray today, you will receive an invoice from [facility]. Please contact [facility] at [phone] with questions or concerns regarding your invoice.   IF you received labwork today, you will receive an invoice from [facility]. Please contact [facility] at [phone] with questions or concerns regarding your invoice.   Our billing staff will not be able to assist you with questions regarding bills from these companies.  You will be contacted with the lab results as soon as they are available. The fastest way to get your results is to activate your My Chart account. Instructions are located on the last page of this paperwork. If you have not heard from us regarding the results in 2 weeks, please contact this office.

## 2016-03-16 NOTE — Progress Notes (Signed)
  03/16/2016 2:58 PM   DOB: Jun 19, 1950 / MRN: [id number]  SUBJECTIVE:  Tyler Vasquez is a 66 y.o. male presenting for cough, leg aching, and HA that occurs with cough only.  Associates runny nose. Reports the illness was very abrupt in onset. He has tried motrin last night. Wife recently seen in the ED and was diagnosed with the flu.  He did have the annual flu shot. History of HTN  He is allergic to amlodipine.    Review of Systems  Constitutional: Positive for chills and malaise/fatigue. Negative for fever.  Respiratory: Positive for cough.   Gastrointestinal: Negative for nausea.  Musculoskeletal: Positive for myalgias.  Neurological: Positive for headaches.    The problem list and medications were reviewed and updated by myself where necessary and exist elsewhere in the encounter.   OBJECTIVE:  BP (!) 150/80 (BP Location: Right Arm, Patient Position: Sitting, Cuff Size: Large)   Pulse 90   Temp 99.2 F (37.3 C) (Oral)   Resp 18   Ht 5\' 7"  (1.702 m)   Wt 188 lb 3.2 oz (85.4 kg)   SpO2 95%   BMI 29.48 kg/m    BP Readings from Last 3 Encounters:  03/16/16 (!) 150/80  11/24/15 120/70  05/12/15 130/90    Physical Exam  Constitutional: He is oriented to person, place, and time. He appears well-developed. He does not appear ill.  Eyes: Conjunctivae and EOM are normal. Pupils are equal, round, and reactive to light.  Cardiovascular: Normal rate and regular rhythm.   Pulmonary/Chest: Effort normal and breath sounds normal.  Abdominal: He exhibits no distension.  Musculoskeletal: Normal range of motion.  Neurological: He is alert and oriented to person, place, and time. No cranial nerve deficit. Coordination normal.  Skin: Skin is warm and dry. He is not diaphoretic.  Psychiatric: He has a normal mood and affect.  Nursing note and vitals reviewed.   No results found for this or any previous visit (from the past 72 hour(s)).  No results found.  ASSESSMENT AND  PLAN:  Adela GlimpseBernard was seen today for cough, nasal congestion and chills.  Diagnoses and all orders for this visit:  Influenza-like illness: Symptoms consistent. Negative for tachycardia and abnormal lungs sounds.  He has been exposed via a loved one.  I am treating for flu.  He will come back in three days if needed.  -     oseltamivir (TAMIFLU) 75 MG capsule; Take 1 capsule (75 mg total) by mouth 2 (two) times daily.  Exposure to influenza: See problem one.   Cough -     benzonatate (TESSALON) 200 MG capsule; Take 1 capsule (200 mg total) by mouth 2 (two) times daily as needed for cough.    The patient is advised to call or return to clinic if he does not see an improvement in symptoms, or to seek the care of the closest emergency department if he worsens with the above plan.   Deliah BostonMichael Wright Gravely, MHS, PA-C [facility] 03/16/2016 2:58 PM

## 2016-05-09 ENCOUNTER — Other Ambulatory Visit: Payer: Self-pay | Admitting: Family Medicine

## 2016-05-10 NOTE — Telephone Encounter (Signed)
Meds ordered this encounter  Medications  . losartan-hydrochlorothiazide (HYZAAR) 100-25 MG tablet    Sig: TAKE 1 TABLET BY MOUTH DAILY    Dispense:  90 tablet    Refill:  0   Patient notified via My Chart. Needs OV.

## 2016-07-09 ENCOUNTER — Encounter: Payer: Self-pay | Admitting: Family Medicine

## 2016-07-09 ENCOUNTER — Ambulatory Visit (INDEPENDENT_AMBULATORY_CARE_PROVIDER_SITE_OTHER): Payer: BLUE CROSS/BLUE SHIELD | Admitting: Family Medicine

## 2016-07-09 VITALS — BP 128/84 | HR 64 | Temp 98.4°F | Resp 17 | Ht 67.0 in | Wt 192.0 lb

## 2016-07-09 DIAGNOSIS — R1033 Periumbilical pain: Secondary | ICD-10-CM | POA: Diagnosis not present

## 2016-07-09 DIAGNOSIS — R195 Other fecal abnormalities: Secondary | ICD-10-CM

## 2016-07-09 LAB — POCT CBC
GRANULOCYTE PERCENT: 36.4 % — AB (ref 37–80)
HEMATOCRIT: 45.2 % (ref 43.5–53.7)
Hemoglobin: 16 g/dL (ref 14.1–18.1)
Lymph, poc: 3.6 — AB (ref 0.6–3.4)
MCH: 33 pg — AB (ref 27–31.2)
MCHC: 35.5 g/dL — AB (ref 31.8–35.4)
MCV: 93.1 fL (ref 80–97)
MID (CBC): 0.5 (ref 0–0.9)
MPV: 7.4 fL (ref 0–99.8)
PLATELET COUNT, POC: 294 10*3/uL (ref 142–424)
POC GRANULOCYTE: 2.3 (ref 2–6.9)
POC LYMPH %: 55.5 % — AB (ref 10–50)
POC MID %: 8.1 % (ref 0–12)
RBC: 4.86 M/uL (ref 4.69–6.13)
RDW, POC: 13.3 %
WBC: 6.4 10*3/uL (ref 4.6–10.2)

## 2016-07-09 LAB — POC HEMOCCULT BLD/STL (OFFICE/1-CARD/DIAGNOSTIC): FECAL OCCULT BLD: NEGATIVE

## 2016-07-09 MED ORDER — PANTOPRAZOLE SODIUM 40 MG PO TBEC: 40.0000 mg | DELAYED_RELEASE_TABLET | Freq: Every day | ORAL | 0 refills | Status: DC

## 2016-07-09 NOTE — Patient Instructions (Addendum)
There is no evidence of any GI bleeding. Your blood count is excellent and your stool did not show any blood.  However, despite this, you should get a colon examination done sometime as you have been directed to. If you have trouble scheduling it you can contact us and we will help do so.  Take the pantoprazole 1 daily for decreasing stomach acid.  If you have further problems don't hesitate to get it rechecked. Especially if you have severe abdominal pain or fever or are passing blood or vomiting a lot you should get checked promptly.  Avoid taking aspirin and ibuprofen and naproxen (Advil and Aleve)  Return as needed    IF you received an x-ray today, you will receive an invoice from [facility]. Please contact [facility] at [phone] with questions or concerns regarding your invoice.   IF you received labwork today, you will receive an invoice from [facility]. Please contact [facility] at [phone] with questions or concerns regarding your invoice.   Our billing staff will not be able to assist you with questions regarding bills from these companies.  You will be contacted with the lab results as soon as they are available. The fastest way to get your results is to activate your My Chart account. Instructions are located on the last page of this paperwork. If you have not heard from [place] regarding the results in 2 weeks, please contact this office.

## 2016-07-09 NOTE — Progress Notes (Signed)
Patient ID: Tyler Vasquez, male    DOB: [birth year]  Age: 66 y.o. MRN: [id number]  Chief Complaint  Patient presents with  . dark stools    3 days and now today looking light again  . Abdominal Cramping    on and off 2 days    Subjective:   66 year old man who has been having some abdominal pain since last week. He just hurts generally in the mid abdomen. His bowels are moving okay. He is not sure if he is ever had any endoscopy procedures done. He has not any nausea or vomiting. He has not had any diarrhea or constipation. He has not had any fever. 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 He did take some Pepto-Bismol last week and then about some Pepto-Bismol tablets and is taken them. He also took some OTC Pepcid. He does not drink, smoke, or use drugs. He does not have any history of major abdominal problems.  Current allergies, medications, problem list, past/family and social histories reviewed.  Objective:  BP 128/84 (BP Location: Right Arm, Patient Position: Sitting, Cuff Size: Large)   Pulse 64   Temp 98.4 F (36.9 C) (Oral)   Resp 17   Ht  (1.702 m)   Wt 192 lb (87.1 kg)   SpO2 96%   BMI 30.07 kg/m   No major acute distress. Conjunctiva appear normal. Throat clear. A little brown staining of his tongue. He had normal bowel sounds. Abdomen was soft without organomegaly, masses or tenderness. Chest was clear. Heart regular with normal rate and rhythm. Digital rectal exam was done and prostate gland feels normal. No blood apparent. The stool is a moderately dark brown but not black in appearance. Hemoccult was collected.  Results for orders placed or performed in visit on 07/09/16  POCT CBC  Result Value Ref Range   WBC 6.4 4.6 - 10.2 K/uL   Lymph, poc 3.6 (A) 0.6 - 3.4   POC LYMPH PERCENT 55.5 (A) 10 - 50 %L   MID (cbc) 0.5 0 - 0.9   POC MID % 8.1 0 - 12 %M   POC Granulocyte 2.3 2 - 6.9   Granulocyte  percent 36.4 (A) 37 - 80 %G   RBC 4.86 4.69 - 6.13 M/uL   Hemoglobin 16.0 14.1 - 18.1 g/dL   HCT, POC 40.9 81.1 - 53.7 %   MCV 93.1 80 - 97 fL   MCH, POC 33.0 (A) 27 - 31.2 pg   MCHC 35.5 (A) 31.8 - 35.4 g/dL   RDW, POC 91.4 %   Platelet Count, POC 294 142 - 424 K/uL   MPV 7.4 0 - 99.8 fL  POC Hemoccult Bld/Stl (1-Cd Office Dx)  Result Value Ref Range   Card #1 Date 07-09-16    Fecal Occult Blood, POC Negative Negative     Assessment & Plan:   Assessment: 1. Periumbilical abdominal pain   2. Dark stools       Plan: We'll check a CBC and stool Hemoccult on him. I think he has a nonspecific abdominal pain and probably had dark stools from the Pepto-Bismol.  Orders Placed This Encounter  Procedures  . POCT CBC  . POC Hemoccult Bld/Stl (1-Cd Office Dx)    Meds ordered this encounter  Medications  . pantoprazole (PROTONIX) 40 MG tablet    Sig: Take 1 tablet (40 mg total) by mouth daily.    Dispense:  30 tablet  Refill:  0         Patient Instructions   There is no evidence of any GI bleeding. Your blood count is excellent and your stool did not show any blood.  However, despite this, you should get a colon examination done sometime as you have been directed to. If you have trouble scheduling it you can contact us and we will help do so.  Take the pantoprazole 1 daily for decreasing stomach acid.  If you have further problems don't hesitate to get it rechecked. Especially if you have severe abdominal pain or fever or are passing blood or vomiting a lot you should get checked promptly.  Avoid taking aspirin and ibuprofen and naproxen (Advil and Aleve)  Return as needed    IF you received an x-ray today, you will receive an invoice from [facility]. Please contact [facility] at [phone] with questions or concerns regarding your invoice.   IF you received labwork today, you will receive an invoice from [facility]. Please contact [facility] at  [phone] with questions or concerns regarding your invoice.   Our billing staff will not be able to assist you with questions regarding bills from these companies.  You will be contacted with the lab results as soon as they are available. The fastest way to get your results is to activate your My Chart account. Instructions are located on the last page of this paperwork. If you have not heard from [place] regarding the results in 2 weeks, please contact this office.        Return if symptoms worsen or fail to improve.   Bernadean Saling, MD 07/09/2016

## 2016-08-05 ENCOUNTER — Other Ambulatory Visit: Payer: Self-pay | Admitting: Physician Assistant

## 2016-08-05 ENCOUNTER — Other Ambulatory Visit: Payer: Self-pay | Admitting: Family Medicine

## 2016-08-05 DIAGNOSIS — R1033 Periumbilical pain: Secondary | ICD-10-CM

## 2016-09-05 ENCOUNTER — Other Ambulatory Visit: Payer: Self-pay | Admitting: Family Medicine

## 2016-10-31 ENCOUNTER — Other Ambulatory Visit: Payer: Self-pay | Admitting: Family Medicine

## 2016-11-16 ENCOUNTER — Ambulatory Visit (INDEPENDENT_AMBULATORY_CARE_PROVIDER_SITE_OTHER): Payer: BLUE CROSS/BLUE SHIELD | Admitting: Family Medicine

## 2016-11-16 ENCOUNTER — Encounter: Payer: Self-pay | Admitting: Family Medicine

## 2016-11-16 VITALS — BP 140/90 | HR 58 | Temp 97.6°F | Resp 18 | Ht 67.0 in | Wt 189.8 lb

## 2016-11-16 DIAGNOSIS — R972 Elevated prostate specific antigen [PSA]: Secondary | ICD-10-CM | POA: Diagnosis not present

## 2016-11-16 DIAGNOSIS — Z1212 Encounter for screening for malignant neoplasm of rectum: Secondary | ICD-10-CM

## 2016-11-16 DIAGNOSIS — E7800 Pure hypercholesterolemia, unspecified: Secondary | ICD-10-CM | POA: Diagnosis not present

## 2016-11-16 DIAGNOSIS — I10 Essential (primary) hypertension: Secondary | ICD-10-CM

## 2016-11-16 DIAGNOSIS — Z1211 Encounter for screening for malignant neoplasm of colon: Secondary | ICD-10-CM

## 2016-11-16 DIAGNOSIS — R1033 Periumbilical pain: Secondary | ICD-10-CM | POA: Diagnosis not present

## 2016-11-16 MED ORDER — PRAVASTATIN SODIUM 40 MG PO TABS: 40.0000 mg | ORAL_TABLET | Freq: Every day | ORAL | 0 refills | Status: DC

## 2016-11-16 MED ORDER — PANTOPRAZOLE SODIUM 40 MG PO TBEC: DELAYED_RELEASE_TABLET | ORAL | 0 refills | Status: DC

## 2016-11-16 MED ORDER — LOSARTAN POTASSIUM-HCTZ 100-25 MG PO TABS: 1.0000 | ORAL_TABLET | Freq: Every day | ORAL | 0 refills | Status: DC

## 2016-11-16 NOTE — Progress Notes (Signed)
Subjective:  By signing my name below, I, Moises Blood, attest that this documentation has been prepared under the direction and in the presence of Delman Cheadle, MD. Electronically Signed: Moises Blood, [place]. 11/16/2016 , 5:37 PM .  Patient was seen in Room 3 .   Patient ID: Tyler Vasquez, male    DOB: September 07, 1950, 66 y.o.   MRN: [id number] Chief Complaint  Patient presents with  . Medication Refill    Losartan Potassium-HCTZ, Pravastatin 40 MG, Protonix 40 MG   HPI Tyler Vasquez is a 66 y.o. male who presents to Primary Care at [facility] for medication refill. Last visit with me was a year ago. He occasionally checks his BP at the pharmacy and is normal. He is not fasting today, last meal at 12:00PM today. He takes losartan-HCTZ 100-64m, pravastatin 422m and Protonix 4028m  His BP was elevated during triage as he just got off work, with some stress. He denies headache, dizziness, muscle cramps, cough, chest pain or chest tightness. His work requires physical work, but also does push ups and walking his dog at home.   Past Medical History:  Diagnosis Date  . Hep C w/o coma, chronic ([id number]  . Hyperlipidemia   . Hypertension   . PPD positive 1999  . Syphili, latent 1999   Prior to Admission medications   Medication Sig Start Date End Date Taking? Authorizing Provider  Alprostadil (PROSTAGLANDIN E1) POWD 40 mcg/mL by Intracavernosal route daily as needed (erectile dysfunction). May use up to 1 ml (40 mcg)/dose.    [provider]  losartan-hydrochlorothiazide (HYZAAR) 100-25 MG tablet TAKE 1 TABLET BY MOUTH DAILY 11/02/16   ShaShawnee KnappD  Multiple Vitamin (MULTIVITAMIN WITH MINERALS) TABS tablet Take 1 tablet by mouth daily.    [provider]  pantoprazole (PROTONIX) 40 MG tablet TAKE 1 TABLET(40 MG) BY MOUTH DAILY 08/06/16   HopPosey BoyerD  pravastatin (PRAVACHOL) 40 MG tablet TAKE 1 TABLET BY MOUTH DAILY 11/02/16   ShaShawnee KnappD   Allergies  Allergen  Reactions  . Amlodipine     ed   \No past surgical history on file.   Family History  Problem Relation Age of Onset  . Alcohol abuse Other   . Hypertension Other   . Hypertension Mother   . Alcohol abuse Father   . Cancer Neg Hx   . Diabetes Neg Hx   . Early death Neg Hx   . Heart disease Neg Hx   . Hyperlipidemia Neg Hx   . Kidney disease Neg Hx   . Stroke Neg Hx    Social History   Social History  . Marital status: Single    Spouse name: N/A  . Number of children: N/A  . Years of education: N/A   Social History Main Topics  . Smoking status: Former [employer] scientist (life sciences) Smokeless tobacco: Never Used  . Alcohol use No  . Drug use: No  . Sexual activity: Yes   Other Topics Concern  . None   Social History Narrative   Just released from prison, served a 16 year term for vehicular manslaughter   [facility] 11/16/2016 07/09/2016 03/16/2016 11/24/2015 05/12/2015  Decreased Interest 0 0 0 0 0  Down, Depressed, Hopeless 0 0 0 0 0  PHQ - 2 Score 0 0 0 0 0    Review of Systems  Constitutional: Negative for fatigue and unexpected weight change.  Eyes: Negative for visual disturbance.  Respiratory:  Negative for cough, chest tightness and shortness of breath.   Cardiovascular: Negative for chest pain, palpitations and leg swelling.  Gastrointestinal: Negative for abdominal pain and blood in stool.  Neurological: Negative for dizziness, light-headedness and headaches.       Objective:   Physical Exam  Constitutional: He is oriented to person, place, and time. He appears well-developed and well-nourished. No distress.  HENT:  Head: Normocephalic and atraumatic.  Eyes: Pupils are equal, round, and reactive to light. EOM are normal.  Neck: Neck supple. No JVD present. Carotid bruit is not present.  Cardiovascular: Normal rate, regular rhythm, S1 normal, S2 normal and normal heart sounds.   No murmur heard. Pulmonary/Chest: Effort normal and breath sounds normal. No  respiratory distress. He has no decreased breath sounds. He has no rales.  Musculoskeletal: Normal range of motion. He exhibits no edema.  Neurological: He is alert and oriented to person, place, and time.  Skin: Skin is warm and dry.  Psychiatric: He has a normal mood and affect. His behavior is normal.  Nursing note and vitals reviewed.   BP (!) 157/82 (BP Location: Right Arm, Patient Position: Sitting, Cuff Size: Large)   Pulse (!) 58   Temp 97.6 F (36.4 C) (Oral)   Resp 18   Ht '5\' 7"'  (1.702 m)   Wt 189 lb 12.8 oz (86.1 kg)   SpO2 98%   BMI 29.73 kg/m     140/90 on recheck Assessment & Plan:   1. Essential hypertension - elevated today due to stress - work/rushing to get here. Maxed out on current med and reports nml outside of office - will recheck on CPE within the next few wks and start new med if still elevated  2. Pure hypercholesterolemia - not fasting today but will sched CPE with fasting lab in next few wks   3. PSA elevation -pt choosing to hold off on any further urology f/u at this point but need to f/u closely as last bx did show potential for adenocarcinoma  4. Periumbilical abdominal pain   5. Screening for colorectal cancer - never received kit though ordered last yr at 29 - reorder   Obtain some labs today so we can review results at CPE  Orders Placed This Encounter  Procedures  . Comprehensive metabolic panel  . PSA  . Cologuard  . Care order/instruction:    Scheduling Instructions:     Recheck BP    Meds ordered this encounter  Medications  . losartan-hydrochlorothiazide (HYZAAR) 100-25 MG tablet    Sig: Take 1 tablet by mouth daily.    Dispense:  90 tablet    Refill:  0    Needs office visit  . pravastatin (PRAVACHOL) 40 MG tablet    Sig: Take 1 tablet (40 mg total) by mouth daily.    Dispense:  90 tablet    Refill:  0  . pantoprazole (PROTONIX) 40 MG tablet    Sig: TAKE 1 TABLET(40 MG) BY MOUTH DAILY    Dispense:  90 tablet    Refill:  0     I personally performed the services described in this documentation, which was scribed in my presence. The recorded information has been reviewed and considered, and addended by me as needed.   Delman Cheadle, M.D.  Primary Care at [facility] [address] [place], [place] [address] [phone] phone [phone] fax  11/17/16 11:32 PM

## 2016-11-16 NOTE — Patient Instructions (Addendum)
   IF you received an x-ray today, you will receive an invoice from [facility]. Please contact [facility] at [phone] with questions or concerns regarding your invoice.   IF you received labwork today, you will receive an invoice from [facility]. Please contact [facility] at [phone] with questions or concerns regarding your invoice.   Our billing staff will not be able to assist you with questions regarding bills from these companies.  You will be contacted with the lab results as soon as they are available. The fastest way to get your results is to activate your My Chart account. Instructions are located on the last page of this paperwork. If you have not heard from us regarding the results in 2 weeks, please contact this office.      Managing Your Hypertension Hypertension is commonly called high blood pressure. This is when the force of your blood pressing against the walls of your arteries is too strong. Arteries are blood vessels that carry blood from your heart throughout your body. Hypertension forces the heart to work harder to pump blood, and may cause the arteries to become narrow or stiff. Having untreated or uncontrolled hypertension can cause heart attack, stroke, kidney disease, and other problems. What are blood pressure readings? A blood pressure reading consists of a higher number over a lower number. Ideally, your blood pressure should be below 120/80. The first ("top") number is called the systolic pressure. It is a measure of the pressure in your arteries as your heart beats. The second ("bottom") number is called the diastolic pressure. It is a measure of the pressure in your arteries as the heart relaxes. What does my blood pressure reading mean? Blood pressure is classified into four stages. Based on your blood pressure reading, your health care provider may use the following stages to determine what type of treatment you need, if any. Systolic  pressure and diastolic pressure are measured in a unit called mm Hg. Normal  Systolic pressure: below 120.  Diastolic pressure: below 80. Elevated  Systolic pressure: 120-129.  Diastolic pressure: below 80. Hypertension stage 1  Systolic pressure: 130-139.  Diastolic pressure: 80-89. Hypertension stage 2  Systolic pressure: 140 or above.  Diastolic pressure: 90 or above. What health risks are associated with hypertension? Managing your hypertension is an important responsibility. Uncontrolled hypertension can lead to:  A heart attack.  A stroke.  A weakened blood vessel (aneurysm).  Heart failure.  Kidney damage.  Eye damage.  Metabolic syndrome.  Memory and concentration problems.  What changes can I make to manage my hypertension? Hypertension can be managed by making lifestyle changes and possibly by taking medicines. Your health care provider will help you make a plan to bring your blood pressure within a normal range. Eating and drinking  Eat a diet that is high in fiber and potassium, and low in salt (sodium), added sugar, and fat. An example eating plan is called the DASH (Dietary Approaches to Stop Hypertension) diet. To eat this way: ? Eat plenty of fresh fruits and vegetables. Try to fill half of your plate at each meal with fruits and vegetables. ? Eat whole grains, such as whole wheat pasta, brown rice, or whole grain bread. Fill about one quarter of your plate with whole grains. ? Eat low-fat diary products. ? Avoid fatty cuts of meat, processed or cured meats, and poultry with skin. Fill about one quarter of your plate with lean proteins such as fish, chicken without skin, beans, eggs,   and tofu. ? Avoid premade and processed foods. These tend to be higher in sodium, added sugar, and fat.  Reduce your daily sodium intake. Most people with hypertension should eat less than 1,500 mg of sodium a day.  Limit alcohol intake to no more than 1 drink a day  for nonpregnant women and 2 drinks a day for men. One drink equals 12 oz of beer, 5 oz of wine, or 1 oz of hard liquor. Lifestyle  Work with your health care provider to maintain a healthy body weight, or to lose weight. Ask what an ideal weight is for you.  Get at least 30 minutes of exercise that causes your heart to beat faster (aerobic exercise) most days of the week. Activities may include walking, swimming, or biking.  Include exercise to strengthen your muscles (resistance exercise), such as weight lifting, as part of your weekly exercise routine. Try to do these types of exercises for 30 minutes at least 3 days a week.  Do not use any products that contain nicotine or tobacco, such as cigarettes and e-cigarettes. If you need help quitting, ask your health care provider.  Control any long-term (chronic) conditions you have, such as high cholesterol or diabetes. Monitoring  Monitor your blood pressure at home as told by your health care provider. Your personal target blood pressure may vary depending on your medical conditions, your age, and other factors.  Have your blood pressure checked regularly, as often as told by your health care provider. Working with your health care provider  Review all the medicines you take with your health care provider because there may be side effects or interactions.  Talk with your health care provider about your diet, exercise habits, and other lifestyle factors that may be contributing to hypertension.  Visit your health care provider regularly. Your health care provider can help you create and adjust your plan for managing hypertension. Will I need medicine to control my blood pressure? Your health care provider may prescribe medicine if lifestyle changes are not enough to get your blood pressure under control, and if:  Your systolic blood pressure is 130 or higher.  Your diastolic blood pressure is 80 or higher.  Take medicines only as told  by your health care provider. Follow the directions carefully. Blood pressure medicines must be taken as prescribed. The medicine does not work as well when you skip doses. Skipping doses also puts you at risk for problems. Contact a health care provider if:  You think you are having a reaction to medicines you have taken.  You have repeated (recurrent) headaches.  You feel dizzy.  You have swelling in your ankles.  You have trouble with your vision. Get help right away if:  You develop a severe headache or confusion.  You have unusual weakness or numbness, or you feel faint.  You have severe pain in your chest or abdomen.  You vomit repeatedly.  You have trouble breathing. Summary  Hypertension is when the force of blood pumping through your arteries is too strong. If this condition is not controlled, it may put you at risk for serious complications.  Your personal target blood pressure may vary depending on your medical conditions, your age, and other factors. For most people, a normal blood pressure is less than 120/80.  Hypertension is managed by lifestyle changes, medicines, or both. Lifestyle changes include weight loss, eating a healthy, low-sodium diet, exercising more, and limiting alcohol. This information is not intended to replace advice   given to you by your health care provider. Make sure you discuss any questions you have with your health care provider. Document Released: 11/21/2011 Document Revised: 01/25/2016 Document Reviewed: 01/25/2016 Elsevier Interactive Patient Education  2018 [employer]  

## 2016-11-17 LAB — PSA: Prostate Specific Ag, Serum: 8.5 ng/mL — ABNORMAL HIGH (ref 0.0–4.0)

## 2016-11-17 LAB — COMPREHENSIVE METABOLIC PANEL
ALK PHOS: 74 IU/L (ref 39–117)
ALT: 20 IU/L (ref 0–44)
AST: 26 IU/L (ref 0–40)
Albumin/Globulin Ratio: 1.4 (ref 1.2–2.2)
Albumin: 4.2 g/dL (ref 3.6–4.8)
BUN/Creatinine Ratio: 12 (ref 10–24)
BUN: 17 mg/dL (ref 8–27)
Bilirubin Total: 0.4 mg/dL (ref 0.0–1.2)
CALCIUM: 9.4 mg/dL (ref 8.6–10.2)
CO2: 24 mmol/L (ref 20–29)
CREATININE: 1.42 mg/dL — AB (ref 0.76–1.27)
Chloride: 102 mmol/L (ref 96–106)
GFR calc Af Amer: 59 mL/min/{1.73_m2} — ABNORMAL LOW (ref >59)
GFR, EST NON AFRICAN AMERICAN: 51 mL/min/{1.73_m2} — AB (ref >59)
Globulin, Total: 3.1 g/dL (ref 1.5–4.5)
Glucose: 94 mg/dL (ref 65–99)
POTASSIUM: 4.2 mmol/L (ref 3.5–5.2)
SODIUM: 141 mmol/L (ref 134–144)
Total Protein: 7.3 g/dL (ref 6.0–8.5)

## 2016-11-23 ENCOUNTER — Telehealth: Payer: Self-pay | Admitting: Family Medicine

## 2016-11-23 NOTE — Telephone Encounter (Signed)
Called patient and advised him that office will be closed on Saturday, 11/24/16.  Team member will call him early next week to reschedule his CPE.  Please reschedule CPE with Dr. Clelia Croft.

## 2016-11-24 ENCOUNTER — Encounter: Payer: BLUE CROSS/BLUE SHIELD | Admitting: Family Medicine

## 2016-11-26 NOTE — Telephone Encounter (Signed)
MyChart message sent to pt about making his CPE

## 2016-11-30 ENCOUNTER — Encounter: Payer: Self-pay | Admitting: Family Medicine

## 2016-11-30 NOTE — Progress Notes (Signed)
Subjective:    Patient ID: Tyler Vasquez, male    DOB: 08/28/1950, 66 y.o.   MRN: [id number]  Chief Complaint  Patient presents with  . Annual Exam    HPI Tyler Vasquez is a 66 y.o. male who presents to the [facility] for complete physical. 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  Primary Preventative Screenings: Prostate Cancer: Checked 2 wks ago - chronically elevated but stable (see below) STI screening: h/o latent syphilis (RPR NR 11/2014), HSVII. and Hep C but cleared as undetectable viral load 11/2014.  (Also has h/o TB). Colorectal Cancer: did not sched colonoscopy referral prior and never received cologuard so reordered cologuard 2 wks prior nor - he talked to the cologuard rep who told him to check to ensure he had insurance coverage prior to proceeding Tobacco use/Lung/EtOH/Illicit substances: h/o tobacco use - stopped 20 years ago.  AAA screen: ordered last yr but never scheduled? Cardiac: EKG 2016 Weight/Blood sugar/Diet/Exercise: Very active job - a lot of stairs. Walks his dog, does body weight exercises.  Working on heart healhty diet - low salt, minimal fried - lots of baked. OTC/Vit/Supp/Herbal: not taking asa 81 Dentist/Optho: no recent visits to either. Immunizations:  Immunization History  Administered Date(s) Administered  . Influenza Split 02/13/2012  . Influenza,inj,Quad PF,6+ Mos 11/20/2014, 11/24/2015  . Tdap 02/13/2012    Chronic Medical Conditions: HTN:  Has been on hyzaar 100-25 for 2 yrs. Checks his BP occ at pharmacy and is slightly elevated. He just bought a home BP cuff.  EGFR 78->66->70->59 last checked 2 wks ago with baseline Cr gradually increasing 1.15->1.32->1.25->1.42.  HLD: H/o triglyceride s300-400s w/ LDL 141 so started on pravastatin 40 1 yr prior - not returned for recheck since starting  Gastritis: started on protonix when symptomatic 07/09/16 only takes prn - not  daily.  H/o Hepatitis C: Undetectable viral load -checked 2014, 11/20/2014 so must have self-cleared, monitoring. All prior LFTs nml.  Chronically elevated PSA: PSA has been stable for 10 years (2008 was first bx) so pt has refused to follow up with urology further. Has seen Dr Beatrix Fetters; urology in the past and last prostate biopsy done 04/01/2015 which showed 1 area (left lateral base) with high grade prostatic intraepithelial neoplasia and 2 areas that were atypical and suspicous for adenocarcinoma but there wasinsufficient cytologic / architectural atypia to establish a definitive diagnosis. ED: failed phosphodiesterase inh. only responds to prostaglandin E1 20 mcg injections.     Lab Results  Component Value Date   PSA1 8.5 (H) 11/16/2016  09/23/2015 PSA 7.5 04/01/2015 PSA 9.5 at [facility] Lab Results  Component Value Date   PSA 9.16 (H) 11/20/2014   PSA 8.48 (H) 02/13/2012      Depression screen PHQ 2/9 11/16/2016 07/09/2016 03/16/2016 11/24/2015 05/12/2015  Decreased Interest 0 0 0 0 0  Down, Depressed, Hopeless 0 0 0 0 0  PHQ - 2 Score 0 0 0 0 0   Past Medical History:  Diagnosis Date  . Hep C w/o coma, chronic ([place]) 1999  . Hyperlipidemia   . Hypertension   . PPD positive 1999  . Syphili, latent 1999   No past surgical history on file. Current Outpatient Prescriptions on File Prior to Visit  Medication Sig Dispense Refill  . Alprostadil (PROSTAGLANDIN E1) POWD 40 mcg/mL by Intracavernosal route daily as needed (erectile dysfunction). May use up to 1 ml (40 mcg)/dose.    Marland Kitchen  losartan-hydrochlorothiazide (HYZAAR) 100-25 MG tablet Take 1 tablet by mouth daily. 90 tablet 0  . Multiple Vitamin (MULTIVITAMIN WITH MINERALS) TABS tablet Take 1 tablet by mouth daily.    . pantoprazole (PROTONIX) 40 MG tablet TAKE 1 TABLET(40 MG) BY MOUTH DAILY 90 tablet 0  . pravastatin (PRAVACHOL) 40 MG tablet Take 1 tablet (40 mg total) by mouth daily. 90 tablet 0   No current  facility-administered medications on file prior to visit.    Allergies  Allergen Reactions  . Amlodipine     ed   Family History  Problem Relation Age of Onset  . Alcohol abuse Other   . Hypertension Other   . Hypertension Mother   . Alcohol abuse Father   . Cancer Neg Hx   . Diabetes Neg Hx   . Early death Neg Hx   . Heart disease Neg Hx   . Hyperlipidemia Neg Hx   . Kidney disease Neg Hx   . Stroke Neg Hx    Social History   Social History  . Marital status: Single    Spouse name: N/A  . Number of children: N/A  . Years of education: N/A   Social History Main Topics  . Smoking status: Former Research scientist (life sciences)  . Smokeless tobacco: Never Used  . Alcohol use No  . Drug use: No  . Sexual activity: Yes   Other Topics Concern  . Not on file   Social History Narrative   Just released from prison, served a 16 year term for vehicular manslaughter    Depression screen [facility] 2/9 11/16/2016 07/09/2016 03/16/2016 11/24/2015 05/12/2015  Decreased Interest 0 0 0 0 0  Down, Depressed, Hopeless 0 0 0 0 0  PHQ - 2 Score 0 0 0 0 0    Review of Systems  Constitutional: Negative for activity change, appetite change and unexpected weight change.  HENT: Positive for postnasal drip. Negative for sore throat and trouble swallowing.   All other systems reviewed and are negative.      Objective:   Physical Exam  Constitutional: He is oriented to person, place, and time. He appears well-developed and well-nourished. No distress.  HENT:  Head: Normocephalic and atraumatic.  Right Ear: Tympanic membrane, external ear and ear canal normal.  Left Ear: Tympanic membrane, external ear and ear canal normal.  Nose: Nose normal.  Mouth/Throat: Oropharynx is clear and moist and mucous membranes are normal. No oropharyngeal exudate.  Eyes: Conjunctivae are normal. No scleral icterus.  Neck: Normal range of motion. Neck supple. No thyromegaly present.  Cardiovascular: Normal rate, regular rhythm, normal  heart sounds and intact distal pulses.   Pulmonary/Chest: Effort normal and breath sounds normal. No respiratory distress.  Abdominal: Soft. Bowel sounds are normal. He exhibits no distension and no mass. There is no tenderness. There is no rebound and no guarding.  Musculoskeletal: He exhibits no edema.  Lymphadenopathy:    He has no cervical adenopathy.  Neurological: He is alert and oriented to person, place, and time.  Skin: Skin is warm and dry. He is not diaphoretic. No erythema.  Psychiatric: He has a normal mood and affect. His behavior is normal.      BP (!) 151/91   Pulse 61   Temp 97.8 F (36.6 C) (Oral)   Resp 16   Ht '5\' 7"'$  (1.702 m)   Wt 189 lb (85.7 kg)   SpO2 98%   BMI 29.60 kg/m    Visual Acuity Screening   Right eye Left  eye Both eyes  Without correction: _0  With correction:      Results for orders placed or performed in visit on 12/01/16  POCT urinalysis dipstick  Result Value Ref Range   Color, UA yellow yellow   Clarity, UA clear clear   Glucose, UA negative negative mg/dL   Bilirubin, UA negative negative   Ketones, POC UA negative negative mg/dL   Spec Grav, UA 1.015 1.010 - 1.025   Blood, UA negative negative   pH, UA 6.0 5.0 - 8.0   Protein Ur, POC negative negative mg/dL   Urobilinogen, UA 0.2 0.2 or 1.0 E.U./dL   Nitrite, UA Negative Negative   Leukocytes, UA Negative Negative  POCT Microscopic Urinalysis ([facility])  Result Value Ref Range   WBC,UR,HPF,POC None None WBC/hpf   RBC,UR,HPF,POC None None RBC/hpf   Bacteria None None, Too numerous to count   Mucus Absent Absent   Epithelial Cells, UR Per Microscopy None None, Too numerous to count cells/hpf     Assessment & Plan:  Flu shot, prevnar-13 shingrix  1. Annual physical exam   2. Routine screening for STI (sexually transmitted infection)   3. Screening for cardiovascular, respiratory, and genitourinary diseases   4. Screening for colorectal cancer - pt reports the  cologuard rep told him to call his insurance to verify coverage prior to the kit being shipped to him - pt plans to do this on Monday.  5. Screening for deficiency anemia   6. Screening for thyroid disorder   7. Pure hypercholesterolemia - improved but trig and non-hdl still much to high so change to atorvastatin 40 rather than pravastatin 40.  8. Essential hypertension, benign - BP above goal on losartan-hctz 100-25 so will start amlodipine 5 and change to olmesartan 40-hctz 25 as comes all in single triple-combo pill   9. Need for prophylactic vaccination and inoculation against influenza   10.    GERD - only taking protonix prn - not needing most of the time. Ok to refill prn 11.    History of tobacco use - Needs abd [place] to screen for AAA as he is a former smoker - ordered last yr but pt never got called about it -  never scheduled.  Orders Placed This Encounter  Procedures  . GC/Chlamydia Probe Amp  . Pneumococcal conjugate vaccine 13-valent IM  . Flu Vaccine QUAD 36+ mos IM  . Comprehensive metabolic panel    Order Specific Question:   Has the patient fasted?    Answer:   Yes  . Lipid panel    Order Specific Question:   Has the patient fasted?    Answer:   Yes  . TSH  . CBC with Differential/Platelet  . HIV antibody  . RPR  . POCT urinalysis dipstick  . POCT Microscopic Urinalysis ([facility])    Meds ordered this encounter  Medications  . Zoster Vac Recomb Adjuvanted [facility]) injection    Sig: Inject 0.5 mLs into the muscle once. Repeat in 2-6 months    Dispense:  0.5 mL    Refill:  1  . Olmesartan-Amlodipine-HCTZ 40-5-25 MG TABS    Sig: Take 1 tablet by mouth daily.    Dispense:  90 tablet    Refill:  1  . aspirin EC 81 MG tablet    Sig: Take 1 tablet (81 mg total) by mouth daily.     Delman Cheadle, M.D.  [facility] [place], [place]  [phone] phone [phone] fax  11/30/16 5:34 PM

## 2016-12-01 ENCOUNTER — Encounter: Payer: Self-pay | Admitting: Family Medicine

## 2016-12-01 ENCOUNTER — Ambulatory Visit (INDEPENDENT_AMBULATORY_CARE_PROVIDER_SITE_OTHER): Payer: BLUE CROSS/BLUE SHIELD | Admitting: Family Medicine

## 2016-12-01 VITALS — BP 151/91 | HR 61 | Temp 97.8°F | Resp 16 | Ht 67.0 in | Wt 189.0 lb

## 2016-12-01 DIAGNOSIS — Z130 Encounter for screening for diseases of the blood and blood-forming organs and certain disorders involving the immune mechanism: Secondary | ICD-10-CM | POA: Diagnosis not present

## 2016-12-01 DIAGNOSIS — Z1389 Encounter for screening for other disorder: Secondary | ICD-10-CM

## 2016-12-01 DIAGNOSIS — Z0000 Encounter for general adult medical examination without abnormal findings: Secondary | ICD-10-CM | POA: Diagnosis not present

## 2016-12-01 DIAGNOSIS — Z1211 Encounter for screening for malignant neoplasm of colon: Secondary | ICD-10-CM

## 2016-12-01 DIAGNOSIS — Z1383 Encounter for screening for respiratory disorder NEC: Secondary | ICD-10-CM

## 2016-12-01 DIAGNOSIS — E7800 Pure hypercholesterolemia, unspecified: Secondary | ICD-10-CM

## 2016-12-01 DIAGNOSIS — Z87891 Personal history of nicotine dependence: Secondary | ICD-10-CM

## 2016-12-01 DIAGNOSIS — Z23 Encounter for immunization: Secondary | ICD-10-CM

## 2016-12-01 DIAGNOSIS — Z1329 Encounter for screening for other suspected endocrine disorder: Secondary | ICD-10-CM | POA: Diagnosis not present

## 2016-12-01 DIAGNOSIS — I10 Essential (primary) hypertension: Secondary | ICD-10-CM | POA: Diagnosis not present

## 2016-12-01 DIAGNOSIS — Z1212 Encounter for screening for malignant neoplasm of rectum: Secondary | ICD-10-CM | POA: Diagnosis not present

## 2016-12-01 DIAGNOSIS — Z113 Encounter for screening for infections with a predominantly sexual mode of transmission: Secondary | ICD-10-CM | POA: Diagnosis not present

## 2016-12-01 DIAGNOSIS — Z136 Encounter for screening for cardiovascular disorders: Secondary | ICD-10-CM

## 2016-12-01 LAB — POC MICROSCOPIC URINALYSIS (UMFC): Mucus: ABSENT

## 2016-12-01 LAB — POCT URINALYSIS DIP (MANUAL ENTRY)
Bilirubin, UA: NEGATIVE
GLUCOSE UA: NEGATIVE mg/dL
Ketones, POC UA: NEGATIVE mg/dL
Leukocytes, UA: NEGATIVE
NITRITE UA: NEGATIVE
PROTEIN UA: NEGATIVE mg/dL
RBC UA: NEGATIVE
SPEC GRAV UA: 1.015 (ref 1.010–1.025)
UROBILINOGEN UA: 0.2 U/dL
pH, UA: 6 (ref 5.0–8.0)

## 2016-12-01 MED ORDER — ZOSTER VAC RECOMB ADJUVANTED 50 MCG/0.5ML IM SUSR: 0.5000 mL | Freq: Once | INTRAMUSCULAR | 1 refills | Status: AC

## 2016-12-01 MED ORDER — ASPIRIN EC 81 MG PO TBEC: 81.0000 mg | DELAYED_RELEASE_TABLET | Freq: Every day | ORAL | Status: AC

## 2016-12-01 MED ORDER — OLMESARTAN-AMLODIPINE-HCTZ 40-5-25 MG PO TABS: 1.0000 | ORAL_TABLET | Freq: Every day | ORAL | 1 refills | Status: DC

## 2016-12-01 NOTE — Patient Instructions (Addendum)
Called your insurance to find out if they cover: Guarded. If they do not, call me back so we can get you to the [facility] for a colonoscopy.  You will be called from [facility] imaging to set up an appointment for your abdominal aorta ultrasound to ensure you do not have an aneurysm. If you do not hear from them within 2 weeks, please call our office and talk to our referrals team.     IF you received an x-ray today, you will receive an invoice from [facility]. Please contact [facility] at [phone] with questions or concerns regarding your invoice.   IF you received labwork today, you will receive an invoice from [facility]. Please contact [facility] at [phone] with questions or concerns regarding your invoice.   Our billing staff will not be able to assist you with questions regarding bills from these companies.  You will be contacted with the lab results as soon as they are available. The fastest way to get your results is to activate your My Chart account. Instructions are located on the last page of this paperwork. If you have not heard from [place] regarding the results in 2 weeks, please contact this office.      Managing Your Hypertension Hypertension is commonly called high blood pressure. This is when the force of your blood pressing against the walls of your arteries is too strong. Arteries are blood vessels that carry blood from your heart throughout your body. Hypertension forces the heart to work harder to pump blood, and may cause the arteries to become narrow or stiff. Having untreated or uncontrolled hypertension can cause heart attack, stroke, kidney disease, and other problems. What are blood pressure readings? A blood pressure reading consists of a higher number over a lower number. Ideally, your blood pressure should be below 120/80. The first ("top") number is called the systolic pressure. It is a measure of the pressure in your arteries as your heart beats.  The second ("bottom") number is called the diastolic pressure. It is a measure of the pressure in your arteries as the heart relaxes. What does my blood pressure reading mean? Blood pressure is classified into four stages. Based on your blood pressure reading, your health care provider may use the following stages to determine what type of treatment you need, if any. Systolic pressure and diastolic pressure are measured in a unit called mm Hg. Normal  Systolic pressure: below 120.  Diastolic pressure: below 80. Elevated  Systolic pressure: 120-129.  Diastolic pressure: below 80. Hypertension stage 1  Systolic pressure: 130-139.  Diastolic pressure: 80-89. Hypertension stage 2  Systolic pressure: 140 or above.  Diastolic pressure: 90 or above. What health risks are associated with hypertension? Managing your hypertension is an important responsibility. Uncontrolled hypertension can lead to:  A heart attack.  A stroke.  A weakened blood vessel (aneurysm).  Heart failure.  Kidney damage.  Eye damage.  Metabolic syndrome.  Memory and concentration problems.  What changes can I make to manage my hypertension? Hypertension can be managed by making lifestyle changes and possibly by taking medicines. Your health care provider will help you make a plan to bring your blood pressure within a normal range. Eating and drinking  Eat a diet that is high in fiber and potassium, and low in salt (sodium), added sugar, and fat. An example eating plan is called the DASH (Dietary Approaches to Stop Hypertension) diet. To eat this way: ? Eat plenty of fresh fruits and vegetables. Try  to fill half of your plate at each meal with fruits and vegetables. ? Eat whole grains, such as whole wheat pasta, brown rice, or whole grain bread. Fill about one quarter of your plate with whole grains. ? Eat low-fat diary products. ? Avoid fatty cuts of meat, processed or cured meats, and poultry with  skin. Fill about one quarter of your plate with lean proteins such as fish, chicken without skin, beans, eggs, and tofu. ? Avoid premade and processed foods. These tend to be higher in sodium, added sugar, and fat.  Reduce your daily sodium intake. Most people with hypertension should eat less than 1,500 mg of sodium a day.  Limit alcohol intake to no more than 1 drink a day for nonpregnant women and 2 drinks a day for men. One drink equals 12 oz of beer, 5 oz of wine, or 1 oz of hard liquor. Lifestyle  Work with your health care provider to maintain a healthy body weight, or to lose weight. Ask what an ideal weight is for you.  Get at least 30 minutes of exercise that causes your heart to beat faster (aerobic exercise) most days of the week. Activities may include walking, swimming, or biking.  Include exercise to strengthen your muscles (resistance exercise), such as weight lifting, as part of your weekly exercise routine. Try to do these types of exercises for 30 minutes at least 3 days a week.  Do not use any products that contain nicotine or tobacco, such as cigarettes and e-cigarettes. If you need help quitting, ask your health care provider.  Control any long-term (chronic) conditions you have, such as high cholesterol or diabetes. Monitoring  Monitor your blood pressure at home as told by your health care provider. Your personal target blood pressure may vary depending on your medical conditions, your age, and other factors.  Have your blood pressure checked regularly, as often as told by your health care provider. Working with your health care provider  Review all the medicines you take with your health care provider because there may be side effects or interactions.  Talk with your health care provider about your diet, exercise habits, and other lifestyle factors that may be contributing to hypertension.  Visit your health care provider regularly. Your health care provider can  help you create and adjust your plan for managing hypertension. Will I need medicine to control my blood pressure? Your health care provider may prescribe medicine if lifestyle changes are not enough to get your blood pressure under control, and if:  Your systolic blood pressure is 130 or higher.  Your diastolic blood pressure is 80 or higher.  Take medicines only as told by your health care provider. Follow the directions carefully. Blood pressure medicines must be taken as prescribed. The medicine does not work as well when you skip doses. Skipping doses also puts you at risk for problems. Contact a health care provider if:  You think you are having a reaction to medicines you have taken.  You have repeated (recurrent) headaches.  You feel dizzy.  You have swelling in your ankles.  You have trouble with your vision. Get help right away if:  You develop a severe headache or confusion.  You have unusual weakness or numbness, or you feel faint.  You have severe pain in your chest or abdomen.  You vomit repeatedly.  You have trouble breathing. Summary  Hypertension is when the force of blood pumping through your arteries is too strong. If this  condition is not controlled, it may put you at risk for serious complications.  Your personal target blood pressure may vary depending on your medical conditions, your age, and other factors. For most people, a normal blood pressure is less than 120/80.  Hypertension is managed by lifestyle changes, medicines, or both. Lifestyle changes include weight loss, eating a healthy, low-sodium diet, exercising more, and limiting alcohol. This information is not intended to replace advice given to you by your health care provider. Make sure you discuss any questions you have with your health care provider. Document Released: 11/21/2011 Document Revised: 01/25/2016 Document Reviewed: 01/25/2016 Elsevier Interactive Patient Education  [employer].

## 2016-12-02 LAB — CBC WITH DIFFERENTIAL/PLATELET
BASOS ABS: 0 10*3/uL (ref 0.0–0.2)
Basos: 1 %
EOS (ABSOLUTE): 0.1 10*3/uL (ref 0.0–0.4)
Eos: 2 %
HEMOGLOBIN: 15.4 g/dL (ref 13.0–17.7)
Hematocrit: 43.1 % (ref 37.5–51.0)
Immature Grans (Abs): 0 10*3/uL (ref 0.0–0.1)
Immature Granulocytes: 0 %
LYMPHS ABS: 2.4 10*3/uL (ref 0.7–3.1)
Lymphs: 47 %
MCH: 32.3 pg (ref 26.6–33.0)
MCHC: 35.7 g/dL (ref 31.5–35.7)
MCV: 90 fL (ref 79–97)
MONOCYTES: 14 %
MONOS ABS: 0.7 10*3/uL (ref 0.1–0.9)
NEUTROS ABS: 1.8 10*3/uL (ref 1.4–7.0)
Neutrophils: 36 %
Platelets: 275 10*3/uL (ref 150–379)
RBC: 4.77 x10E6/uL (ref 4.14–5.80)
RDW: 13.7 % (ref 12.3–15.4)
WBC: 5 10*3/uL (ref 3.4–10.8)

## 2016-12-02 LAB — RPR: RPR Ser Ql: NONREACTIVE

## 2016-12-02 LAB — LIPID PANEL
CHOL/HDL RATIO: 6 ratio — AB (ref 0.0–5.0)
Cholesterol, Total: 205 mg/dL — ABNORMAL HIGH (ref 100–199)
HDL: 34 mg/dL — ABNORMAL LOW (ref >39)
LDL CALC: 113 mg/dL — AB (ref 0–99)
Triglycerides: 288 mg/dL — ABNORMAL HIGH (ref 0–149)
VLDL CHOLESTEROL CAL: 58 mg/dL — AB (ref 5–40)

## 2016-12-02 LAB — COMPREHENSIVE METABOLIC PANEL WITH GFR
ALT: 30 [IU]/L (ref 0–44)
AST: 31 [IU]/L (ref 0–40)
Albumin/Globulin Ratio: 1.4 (ref 1.2–2.2)
Albumin: 4.3 g/dL (ref 3.6–4.8)
Alkaline Phosphatase: 75 [IU]/L (ref 39–117)
BUN/Creatinine Ratio: 11 (ref 10–24)
BUN: 15 mg/dL (ref 8–27)
Bilirubin Total: 0.5 mg/dL (ref 0.0–1.2)
CO2: 25 mmol/L (ref 20–29)
Calcium: 9.5 mg/dL (ref 8.6–10.2)
Chloride: 99 mmol/L (ref 96–106)
Creatinine, Ser: 1.42 mg/dL — ABNORMAL HIGH (ref 0.76–1.27)
GFR calc Af Amer: 59 mL/min/{1.73_m2} — ABNORMAL LOW
GFR calc non Af Amer: 51 mL/min/{1.73_m2} — ABNORMAL LOW
Globulin, Total: 3.1 g/dL (ref 1.5–4.5)
Glucose: 98 mg/dL (ref 65–99)
Potassium: 4.6 mmol/L (ref 3.5–5.2)
Sodium: 139 mmol/L (ref 134–144)
Total Protein: 7.4 g/dL (ref 6.0–8.5)

## 2016-12-02 LAB — HIV ANTIBODY (ROUTINE TESTING W REFLEX): HIV Screen 4th Generation wRfx: NONREACTIVE

## 2016-12-02 LAB — TSH: TSH: 1.2 u[IU]/mL (ref 0.450–4.500)

## 2016-12-03 ENCOUNTER — Encounter: Payer: Self-pay | Admitting: Family Medicine

## 2016-12-03 MED ORDER — ATORVASTATIN CALCIUM 40 MG PO TABS: 40.0000 mg | ORAL_TABLET | Freq: Every day | ORAL | 1 refills | Status: DC

## 2016-12-04 LAB — GC/CHLAMYDIA PROBE AMP
CHLAMYDIA, DNA PROBE: NEGATIVE
Neisseria gonorrhoeae by PCR: NEGATIVE

## 2016-12-10 ENCOUNTER — Telehealth: Payer: Self-pay | Admitting: Family Medicine

## 2016-12-10 NOTE — Telephone Encounter (Signed)
Order for U/S AAA is in [facility]'s work que and they should call the pt to schedule. I left the pt a voicemail letting him know where this was sent along with their phone number and that he can call them to schedule if he would rather before they call him. Thanks!

## 2016-12-28 DIAGNOSIS — R972 Elevated prostate specific antigen [PSA]: Secondary | ICD-10-CM | POA: Diagnosis not present

## 2016-12-28 DIAGNOSIS — N5201 Erectile dysfunction due to arterial insufficiency: Secondary | ICD-10-CM | POA: Diagnosis not present

## 2016-12-28 DIAGNOSIS — N4231 Prostatic intraepithelial neoplasia: Secondary | ICD-10-CM | POA: Diagnosis not present

## 2017-03-20 ENCOUNTER — Ambulatory Visit (INDEPENDENT_AMBULATORY_CARE_PROVIDER_SITE_OTHER): Payer: BLUE CROSS/BLUE SHIELD

## 2017-03-20 ENCOUNTER — Ambulatory Visit: Payer: BLUE CROSS/BLUE SHIELD | Admitting: Physician Assistant

## 2017-03-20 VITALS — BP 133/84 | HR 80 | Temp 98.0°F | Resp 18 | Ht 67.0 in | Wt 187.8 lb

## 2017-03-20 DIAGNOSIS — R05 Cough: Secondary | ICD-10-CM

## 2017-03-20 DIAGNOSIS — R053 Chronic cough: Secondary | ICD-10-CM

## 2017-03-20 DIAGNOSIS — J22 Unspecified acute lower respiratory infection: Secondary | ICD-10-CM | POA: Diagnosis not present

## 2017-03-20 MED ORDER — DOXYCYCLINE HYCLATE 100 MG PO CAPS: 100.0000 mg | ORAL_CAPSULE | Freq: Two times a day (BID) | ORAL | 0 refills | Status: AC

## 2017-03-20 NOTE — Progress Notes (Signed)
03/20/2017 6:15 PM   DOB: 1950/06/18 / MRN: [id number]  SUBJECTIVE:  Tyler Vasquez is a 67 y.o. male presenting for cough that has been three weeks now however it has been improving slowly.  This illness was precede by sore throat. He smoked for about 30 years with about a 10 pack year history. Has a history of positive PPD. Denies night sweats.  Complains of left knee pain that started after an impact with the bed two weeks ago. No difficulty with ambulation.  He tells me that he could not bend the knee.   He has No Known Allergies.   He  has a past medical history of Abnormal prostate biopsy (04/01/2015), Hep C w/o coma, chronic ([facility]) (1999), HSV-2 (herpes simplex virus 2) infection, Hyperlipidemia, Hypertension, PPD positive (1999), Syphili, latent (1999), and Tuberculosis (1999).    He  reports that he has quit smoking. he has never used smokeless tobacco. He reports that he does not drink alcohol or use drugs. He  reports that he currently engages in sexual activity. The patient  has no past surgical history on file.  His family history includes Alcohol abuse in his father and other; Hypertension in his mother and other.  Review of Systems  Constitutional: Negative for chills, diaphoresis and fever.  Eyes: Negative.   Respiratory: Negative for shortness of breath.   Cardiovascular: Negative for chest pain, orthopnea and leg swelling.  Gastrointestinal: Negative for abdominal pain, blood in stool, constipation, diarrhea, heartburn, melena, nausea and vomiting.  Genitourinary: Negative for flank pain.  Skin: Negative for rash.  Neurological: Negative for dizziness, sensory change, speech change, focal weakness and headaches.    The problem list and medications were reviewed and updated by myself where necessary and exist elsewhere in the encounter.   OBJECTIVE:  BP 133/84   Pulse 80   Temp 98 F (36.7 C) (Oral)   Resp 18   Ht 5\' 7"  (1.702 m)   Wt 187 lb 12.8 oz (85.2 kg)    SpO2 100%   BMI 29.41 kg/m   Physical Exam  Constitutional: He appears well-developed. He is active and cooperative.  Non-toxic appearance.  Cardiovascular: Normal rate, regular rhythm, S1 normal, S2 normal, normal heart sounds, intact distal pulses and normal pulses. Exam reveals no gallop and no friction rub.  No murmur heard. Pulmonary/Chest: Effort normal. No stridor. No tachypnea. No respiratory distress. He has no wheezes. He has rales (left lower lobe, improved, but persisting with cough).  Abdominal: He exhibits no distension.  Musculoskeletal: He exhibits no edema.  Neurological: He is alert.  Skin: Skin is warm and dry. He is not diaphoretic. No pallor.  Vitals reviewed.   Wt Readings from Last 3 Encounters:  03/20/17 187 lb 12.8 oz (85.2 kg)  12/01/16 189 lb (85.7 kg)  11/16/16 189 lb 12.8 oz (86.1 kg)     No results found for this or any previous visit (from the past 72 hour(s)).  Dg Chest 2 View  Result Date: 03/20/2017 CLINICAL DATA:  Cough for 3 weeks, crackles at LEFT base EXAM: CHEST  2 VIEW COMPARISON:  04/16/2012 FINDINGS: Normal heart size, mediastinal contours, and pulmonary vascularity. Mild peribronchial thickening. No pulmonary infiltrate, pleural effusion, or pneumothorax. Scattered endplate spur formation midthoracic spine. IMPRESSION: Bronchitic changes without infiltrate. Electronically Signed   By: Ulyses SouthwardMark  Boles M.D.   On: 03/20/2017 18:08    ASSESSMENT AND PLAN:  Tyler Vasquez was seen today for cough.  Diagnoses and all orders for this  visit:  Persistent cough for 3 weeks or longer -     DG Chest 2 View; Future  Lower respiratory tract infection -     doxycycline (VIBRAMYCIN) 100 MG capsule; Take 1 capsule (100 mg total) by mouth 2 (two) times daily for 10 days.    The patient is advised to call or return to clinic if he does not see an improvement in symptoms, or to seek the care of the closest emergency department if he worsens with the above plan.     Deliah Boston, MHS, PA-C Primary Care at [facility] 03/20/2017 6:15 PM

## 2017-03-20 NOTE — Patient Instructions (Signed)
     IF you received an x-ray today, you will receive an invoice from [facility]. Please contact [facility] at [phone] with questions or concerns regarding your invoice.   IF you received labwork today, you will receive an invoice from [facility]. Please contact [facility] at [phone] with questions or concerns regarding your invoice.   Our billing staff will not be able to assist you with questions regarding bills from these companies.  You will be contacted with the lab results as soon as they are available. The fastest way to get your results is to activate your My Chart account. Instructions are located on the last page of this paperwork. If you have not heard from us regarding the results in 2 weeks, please contact this office.     Cough, Adult A cough helps to clear your throat and lungs. A cough may last only 2-3 weeks (acute), or it may last longer than 8 weeks (chronic). Many different things can cause a cough. A cough may be a sign of an illness or another medical condition. Follow these instructions at home:  Pay attention to any changes in your cough.  Take medicines only as told by your doctor. ? If you were prescribed an antibiotic medicine, take it as told by your doctor. Do not stop taking it even if you start to feel better. ? Talk with your doctor before you try using a cough medicine.  Drink enough fluid to keep your pee (urine) clear or pale yellow.  If the air is dry, use a cold steam vaporizer or humidifier in your home.  Stay away from things that make you cough at work or at home.  If your cough is worse at night, try using extra pillows to raise your head up higher while you sleep.  Do not smoke, and try not to be around smoke. If you need help quitting, ask your doctor.  Do not have caffeine.  Do not drink alcohol.  Rest as needed. Contact a doctor if:  You have new problems (symptoms).  You cough up yellow fluid  (pus).  Your cough does not get better after 2-3 weeks, or your cough gets worse.  Medicine does not help your cough and you are not sleeping well.  You have pain that gets worse or pain that is not helped with medicine.  You have a fever.  You are losing weight and you do not know why.  You have night sweats. Get help right away if:  You cough up blood.  You have trouble breathing.  Your heartbeat is very fast. This information is not intended to replace advice given to you by your health care provider. Make sure you discuss any questions you have with your health care provider. Document Released: 11/09/2010 Document Revised: 08/04/2015 Document Reviewed: 05/05/2014 Elsevier Interactive Patient Education  2018 Elsevier Inc.  

## 2017-06-05 ENCOUNTER — Other Ambulatory Visit: Payer: Self-pay | Admitting: Family Medicine

## 2017-06-11 ENCOUNTER — Telehealth: Payer: Self-pay | Admitting: Family Medicine

## 2017-06-11 MED ORDER — OLMESARTAN-AMLODIPINE-HCTZ 40-5-25 MG PO TABS: 1.0000 | ORAL_TABLET | Freq: Every day | ORAL | 0 refills | Status: DC

## 2017-06-11 NOTE — Telephone Encounter (Signed)
Copied from CRM #[id number]. Topic: Quick Communication - Rx Refill/Question >> Jun 11, 2017  [phone] PM Stephannie LiSimmons, Tyce Delcid L, NT wrote: Medication: Olmesartan-Amlodipine-HCTZ 40-5-25 MG TABS Has the patient contacted their pharmacy? {yes  (Agent: If no, request that the patient contact the pharmacy for the refill.) Preferred Pharmacy (with phone number or street name):[facility] [id number] - [place] - [address] [facility] [phone] (Phone) [phone] (Fax Agent: Please be advised that RX refills may take up to 3 business days. We ask that you follow-up with your pharmacy. Patient has an appointment scheduled 06/25/17 at 340 , he is also completely out his medication .

## 2017-06-11 NOTE — Telephone Encounter (Signed)
Refill given until pt seen for appt on 4/16.

## 2017-06-25 ENCOUNTER — Ambulatory Visit: Payer: BLUE CROSS/BLUE SHIELD | Admitting: Family Medicine

## 2017-07-12 ENCOUNTER — Ambulatory Visit: Payer: BLUE CROSS/BLUE SHIELD | Admitting: Family Medicine

## 2017-07-13 ENCOUNTER — Encounter: Payer: Self-pay | Admitting: Family Medicine

## 2017-07-13 ENCOUNTER — Ambulatory Visit: Payer: BLUE CROSS/BLUE SHIELD | Admitting: Family Medicine

## 2017-07-13 ENCOUNTER — Other Ambulatory Visit: Payer: Self-pay

## 2017-07-13 VITALS — BP 122/60 | HR 72 | Temp 98.2°F | Resp 18 | Ht 67.0 in | Wt 187.6 lb

## 2017-07-13 DIAGNOSIS — R972 Elevated prostate specific antigen [PSA]: Secondary | ICD-10-CM

## 2017-07-13 DIAGNOSIS — I10 Essential (primary) hypertension: Secondary | ICD-10-CM | POA: Diagnosis not present

## 2017-07-13 DIAGNOSIS — E7800 Pure hypercholesterolemia, unspecified: Secondary | ICD-10-CM | POA: Diagnosis not present

## 2017-07-13 DIAGNOSIS — Z5181 Encounter for therapeutic drug level monitoring: Secondary | ICD-10-CM

## 2017-07-13 DIAGNOSIS — Z136 Encounter for screening for cardiovascular disorders: Secondary | ICD-10-CM | POA: Diagnosis not present

## 2017-07-13 DIAGNOSIS — Z87891 Personal history of nicotine dependence: Secondary | ICD-10-CM

## 2017-07-13 MED ORDER — ATORVASTATIN CALCIUM 40 MG PO TABS: 40.0000 mg | ORAL_TABLET | Freq: Every day | ORAL | 3 refills | Status: DC

## 2017-07-13 MED ORDER — OLMESARTAN-AMLODIPINE-HCTZ 40-5-25 MG PO TABS: 1.0000 | ORAL_TABLET | Freq: Every day | ORAL | 3 refills | Status: DC

## 2017-07-13 NOTE — Patient Instructions (Addendum)
IF you received an x-ray today, you will receive an invoice from [facility]. Please contact [facility] at [phone] with questions or concerns regarding your invoice.   IF you received labwork today, you will receive an invoice from [facility]. Please contact [facility] at [phone] with questions or concerns regarding your invoice.   Our billing staff will not be able to assist you with questions regarding bills from these companies.  You will be contacted with the lab results as soon as they are available. The fastest way to get your results is to activate your My Chart account. Instructions are located on the last page of this paperwork. If you have not heard from [place] regarding the results in 2 weeks, please contact this office.     Heart-Healthy Eating Plan Many factors influence your heart health, including eating and exercise habits. Heart (coronary) risk increases with abnormal blood fat (lipid) levels. Heart-healthy meal planning includes limiting unhealthy fats, increasing healthy fats, and making other small dietary changes. This includes maintaining a healthy body weight to help keep lipid levels within a normal range. What is my plan? Your health care provider recommends that you:  Get no more than _________% of the total calories in your daily diet from fat.  Limit your intake of saturated fat to less than _________% of your total calories each day.  Limit the amount of cholesterol in your diet to less than _________ mg per day.  What types of fat should I choose?  Choose healthy fats more often. Choose monounsaturated and polyunsaturated fats, such as olive oil and canola oil, flaxseeds, walnuts, almonds, and seeds.  Eat more omega-3 fats. Good choices include salmon, mackerel, sardines, tuna, flaxseed oil, and ground flaxseeds. Aim to eat fish at least two times each week.  Limit saturated fats. Saturated fats are primarily found in animal  products, such as meats, butter, and cream. Plant sources of saturated fats include palm oil, palm kernel oil, and coconut oil.  Avoid foods with partially hydrogenated oils in them. These contain trans fats. Examples of foods that contain trans fats are stick margarine, some tub margarines, cookies, crackers, and other baked goods. What general guidelines do I need to follow?  Check food labels carefully to identify foods with trans fats or high amounts of saturated fat.  Fill one half of your plate with vegetables and green salads. Eat 4-5 servings of vegetables per day. A serving of vegetables equals 1 cup of raw leafy vegetables,  cup of raw or cooked cut-up vegetables, or  cup of vegetable juice.  Fill one fourth of your plate with whole grains. Look for the word "whole" as the first word in the ingredient list.  Fill one fourth of your plate with lean protein foods.  Eat 4-5 servings of fruit per day. A serving of fruit equals one medium whole fruit,  cup of dried fruit,  cup of fresh, frozen, or canned fruit, or  cup of 100% fruit juice.  Eat more foods that contain soluble fiber. Examples of foods that contain this type of fiber are apples, broccoli, carrots, beans, peas, and barley. Aim to get 20-30 g of fiber per day.  Eat more home-cooked food and less restaurant, buffet, and fast food.  Limit or avoid alcohol.  Limit foods that are high in starch and sugar.  Avoid fried foods.  Cook foods by using methods other than frying. Baking, boiling, grilling, and broiling are all great options. Other fat-reducing suggestions  include: ? Removing the skin from poultry. ? Removing all visible fats from meats. ? Skimming the fat off of stews, soups, and gravies before serving them. ? Steaming vegetables in water or broth.  Lose weight if you are overweight. Losing just 5-10% of your initial body weight can help your overall health and prevent diseases such as diabetes and heart  disease.  Increase your consumption of nuts, legumes, and seeds to 4-5 servings per week. One serving of dried beans or legumes equals  cup after being cooked, one serving of nuts equals 1 ounces, and one serving of seeds equals  ounce or 1 tablespoon.  You may need to monitor your salt (sodium) intake, especially if you have high blood pressure. Talk with your health care provider or dietitian to get more information about reducing sodium. What foods can I eat? Grains  Breads, including [place], white, pita, wheat, raisin, rye, oatmeal, and [place]. Tortillas that are neither fried nor made with lard or trans fat. Low-fat rolls, including hotdog and hamburger buns and English muffins. Biscuits. Muffins. Waffles. Pancakes. Light popcorn. Whole-grain cereals. Flatbread. Melba toast. Pretzels. Breadsticks. Rusks. Low-fat snacks and crackers, including oyster, saltine, matzo, graham, animal, and rye. Rice and pasta, including brown rice and those that are made with whole wheat. Vegetables All vegetables. Fruits All fruits, but limit coconut. Meats and Other Protein Sources Lean, well-trimmed beef, veal, pork, and lamb. Chicken and [place] without skin. All fish and shellfish. Wild duck, rabbit, pheasant, and venison. Egg whites or low-cholesterol egg substitutes. Dried beans, peas, lentils, and tofu.Seeds and most nuts. Dairy Low-fat or nonfat cheeses, including ricotta, string, and mozzarella. Skim or 1% milk that is liquid, powdered, or evaporated. Buttermilk that is made with low-fat milk. Nonfat or low-fat yogurt. Beverages Mineral water. Diet carbonated beverages. Sweets and Desserts Sherbets and fruit ices. Honey, jam, marmalade, jelly, and syrups. Meringues and gelatins. Pure sugar candy, such as hard candy, jelly beans, gumdrops, mints, marshmallows, and small amounts of dark chocolate. [employer]. Eat all sweets and desserts in moderation. Fats and Oils Nonhydrogenated  (trans-free) margarines. Vegetable oils, including soybean, sesame, sunflower, olive, peanut, safflower, corn, canola, and cottonseed. Salad dressings or mayonnaise that are made with a vegetable oil. Limit added fats and oils that you use for cooking, baking, salads, and as spreads. Other Cocoa powder. Coffee and tea. All seasonings and condiments. The items listed above may not be a complete list of recommended foods or beverages. Contact your dietitian for more options. What foods are not recommended? Grains Breads that are made with saturated or trans fats, oils, or whole milk. Croissants. Butter rolls. Cheese breads. Sweet rolls. Donuts. Buttered popcorn. Chow mein noodles. High-fat crackers, such as cheese or butter crackers. Meats and Other Protein Sources Fatty meats, such as hotdogs, short ribs, sausage, spareribs, bacon, ribeye roast or steak, and mutton. High-fat deli meats, such as salami and bologna. Caviar. Domestic duck and goose. Organ meats, such as kidney, liver, sweetbreads, brains, gizzard, chitterlings, and heart. Dairy Cream, sour cream, cream cheese, and creamed cottage cheese. Whole milk cheeses, including blue (bleu), [address], [place], [place], [address], [place], [address], [place], [place], and [place]. Whole or 2% milk that is liquid, evaporated, or condensed. Whole buttermilk. Cream sauce or high-fat cheese sauce. Yogurt that is made from whole milk. Beverages Regular sodas and drinks with added sugar. Sweets and Desserts Frosting. Pudding. Cookies. Cakes other than angel food cake. Candy that has milk chocolate or white chocolate, hydrogenated fat, butter, coconut,  or unknown ingredients. Buttered syrups. Full-fat ice cream or ice cream drinks. Fats and Oils Gravy that has suet, meat fat, or shortening. Cocoa butter, hydrogenated oils, palm oil, coconut oil, palm kernel oil. These can often be found in baked products, candy, fried foods, nondairy creamers, and whipped  toppings. Solid fats and shortenings, including bacon fat, salt pork, lard, and butter. Nondairy cream substitutes, such as coffee creamers and sour cream substitutes. Salad dressings that are made of unknown oils, cheese, or sour cream. The items listed above may not be a complete list of foods and beverages to avoid. Contact your dietitian for more information. This information is not intended to replace advice given to you by your health care provider. Make sure you discuss any questions you have with your health care provider. Document Released: 12/06/2007 Document Revised: 09/16/2015 Document Reviewed: 08/20/2013 Elsevier Interactive Patient Education  [employer].

## 2017-07-13 NOTE — Progress Notes (Addendum)
Subjective:  By signing my name below, I, Essence Howell, attest that this documentation has been prepared under the direction and in the presence of [name], MD Electronically Signed: Charline Bills, ED Scribe 07/13/2017 at 4:03 PM.   Patient ID: Tyler Vasquez, male    DOB: 04-27-1950, 67 y.o.   MRN: [id number]  Chief Complaint  Patient presents with  . Medication Refill    BP meds olmesartan amlodipine hctz    HPI Tyler Vasquez is a 67 y.o. male who presents to Primary Care at [facility] for a medication refill of Olmesartan-amlodipine-HCTZ.   HTN Pt has been compliant with meds. Occasionally checks his BP outside of the office with readings ranging 135-140/70-80s. Denies side-effects.   HLD Pt is not fasting at this visit.  Acid Reflux Pt stopped Protonix. Rarely has heartburn since stopping medication.  Elevated PSA Pt has been followed by urologist Dr. Retta Diones. States PSA decreased to 8 at his last visit ~8 months ago.  Past Medical History:  Diagnosis Date  . Abnormal prostate biopsy 04/01/2015   by Dr. Retta Diones. Pt refuses to f/u due recurrent biopsies since 2008 w/o clear progression of disease  . Hep C w/o coma, chronic (HCC) 1999  . HSV-2 (herpes simplex virus 2) infection   . Hyperlipidemia   . Hypertension   . PPD positive 1999  . Syphili, latent 1999  . Tuberculosis 1999   Current Outpatient Medications on File Prior to Visit  Medication Sig Dispense Refill  . Alprostadil (PROSTAGLANDIN E1) POWD 40 mcg/mL by Intracavernosal route daily as needed (erectile dysfunction). May use up to 1 ml (40 mcg)/dose.    Marland Kitchen aspirin EC 81 MG tablet Take 1 tablet (81 mg total) by mouth daily.    Marland Kitchen atorvastatin (LIPITOR) 40 MG tablet Take 1 tablet (40 mg total) by mouth daily at 6 PM. 90 tablet 1  . Multiple Vitamin (MULTIVITAMIN WITH MINERALS) TABS tablet Take 1 tablet by mouth daily.    . Olmesartan-amLODIPine-HCTZ 40-5-25 MG TABS Take 1 tablet by mouth daily. 30 tablet 0  .  pantoprazole (PROTONIX) 40 MG tablet TAKE 1 TABLET(40 MG) BY MOUTH DAILY 90 tablet 0   No current facility-administered medications on file prior to visit.    No past surgical history on file.  No Known Allergies Family History  Problem Relation Age of Onset  . Hypertension Mother   . Alcohol abuse Father   . Alcohol abuse Other   . Hypertension Other   . Cancer Neg Hx   . Diabetes Neg Hx   . Early death Neg Hx   . Heart disease Neg Hx   . Hyperlipidemia Neg Hx   . Kidney disease Neg Hx   . Stroke Neg Hx    Social History   Socioeconomic History  . Marital status: Single    Spouse name: Not on file  . Number of children: Not on file  . Years of education: Not on file  . Highest education level: Not on file  Occupational History  . Not on file  Social Needs  . Financial resource strain: Not on file  . Food insecurity:    Worry: Not on file    Inability: Not on file  . Transportation needs:    Medical: Not on file    Non-medical: Not on file  Tobacco Use  . Smoking status: Former Games developer  . Smokeless tobacco: Never Used  Substance and Sexual Activity  . Alcohol use: No  . Drug use: No  .  Sexual activity: Yes  Lifestyle  . Physical activity:    Days per week: Not on file    Minutes per session: Not on file  . Stress: Not on file  Relationships  . Social connections:    Talks on phone: Not on file    Gets together: Not on file    Attends religious service: Not on file    Active member of club or organization: Not on file    Attends meetings of clubs or organizations: Not on file    Relationship status: Not on file  Other Topics Concern  . Not on file  Social History Narrative   served a 16 year prison term for vehicular manslaughter   Depression screen [facility] 2/9 07/13/2017 11/16/2016 07/09/2016 03/16/2016 11/24/2015  Decreased Interest 0 0 0 0 0  Down, Depressed, Hopeless 0 0 0 0 0  PHQ - 2 Score 0 0 0 0 0     Review of Systems  Respiratory: Negative for  shortness of breath.   Cardiovascular: Negative for chest pain.  Neurological: Negative for dizziness, light-headedness and headaches.      Objective:   Physical Exam  Constitutional: He is oriented to person, place, and time. He appears well-developed and well-nourished. No distress.  HENT:  Head: Normocephalic and atraumatic.  Eyes: Conjunctivae and EOM are normal.  Neck: Neck supple. No tracheal deviation present. No thyroid mass and no thyromegaly present.  Cardiovascular: Normal rate and regular rhythm.  Pulmonary/Chest: Effort normal and breath sounds normal. No respiratory distress.  Musculoskeletal: Normal range of motion.  Neurological: He is alert and oriented to person, place, and time.  Skin: Skin is warm and dry.  Psychiatric: He has a normal mood and affect. His behavior is normal.  Nursing note and vitals reviewed.  BP 122/60   Pulse 72   Temp 98.2 F (36.8 C) (Oral)   Resp 18   Ht  (1.702 m)   Wt 187 lb 9.6 oz (85.1 kg)   SpO2 96%   BMI 29.38 kg/m      Assessment & Plan:   1. Essential hypertension, benign - well controlled on olmesartan-amlodipine-hctz 40-5-25 - cont  2. Pure hypercholesterolemia - on lipitor 40  3. Medication monitoring encounter   4. Screening for AAA (abdominal aortic aneurysm) - pt never got called to sched last yr - will re-order  5. History of tobacco use - needs AAA screen - has [employer] who does not currently cover screening lung LDCT scan but will likely change ot medicare within few yrs anad be able to do then. Had CXR 03/2017 w/ bronchitic changes  6. PSA elevation -followed by urologist Dr. Retta Diones. States PSA decreased to 8 at his last visit ~8 months ago. Dr. Retta Diones advise to cont rechecking in q6 mos but pt has not made a f/u urology appt for this so will recheck today and pt can sched urology f/u if increasing.  continue to monitor closely   Refilled meds - no changes  Orders Placed This Encounter  Procedures  .  Comprehensive metabolic panel    Order Specific Question:   Has the patient fasted?    Answer:   Yes  . PSA    Meds ordered this encounter  Medications  . Olmesartan-amLODIPine-HCTZ 40-5-25 MG TABS    Sig: Take 1 tablet by mouth daily.    Dispense:  90 tablet    Refill:  3  . atorvastatin (LIPITOR) 40 MG tablet    Sig: Take  1 tablet (40 mg total) by mouth daily at 6 PM.    Dispense:  90 tablet    Refill:  3    I personally performed the services described in this documentation, which was scribed in my presence. The recorded information has been reviewed and considered, and addended by me as needed.   [name], M.D.  Primary Care at [facility] [address] [place], [place] [address] [phone] phone [phone] fax  08/27/17 12:11 PM

## 2017-07-14 LAB — PSA: PROSTATE SPECIFIC AG, SERUM: 10.9 ng/mL — AB (ref 0.0–4.0)

## 2017-07-14 LAB — COMPREHENSIVE METABOLIC PANEL
ALK PHOS: 94 IU/L (ref 39–117)
ALT: 31 IU/L (ref 0–44)
AST: 24 IU/L (ref 0–40)
Albumin/Globulin Ratio: 1.3 (ref 1.2–2.2)
Albumin: 3.8 g/dL (ref 3.6–4.8)
BUN/Creatinine Ratio: 12 (ref 10–24)
BUN: 18 mg/dL (ref 8–27)
Bilirubin Total: 0.5 mg/dL (ref 0.0–1.2)
CALCIUM: 9 mg/dL (ref 8.6–10.2)
CO2: 22 mmol/L (ref 20–29)
CREATININE: 1.56 mg/dL — AB (ref 0.76–1.27)
Chloride: 104 mmol/L (ref 96–106)
GFR calc Af Amer: 53 mL/min/{1.73_m2} — ABNORMAL LOW (ref >59)
GFR, EST NON AFRICAN AMERICAN: 46 mL/min/{1.73_m2} — AB (ref >59)
GLOBULIN, TOTAL: 2.9 g/dL (ref 1.5–4.5)
Glucose: 122 mg/dL — ABNORMAL HIGH (ref 65–99)
POTASSIUM: 4 mmol/L (ref 3.5–5.2)
SODIUM: 142 mmol/L (ref 134–144)
Total Protein: 6.7 g/dL (ref 6.0–8.5)

## 2017-07-15 ENCOUNTER — Encounter: Payer: Self-pay | Admitting: *Deleted

## 2017-07-16 NOTE — Progress Notes (Signed)
Faxed

## 2017-07-30 ENCOUNTER — Ambulatory Visit (HOSPITAL_COMMUNITY)
Admission: RE | Admit: 2017-07-30 | Discharge: 2017-07-30 | Disposition: A | Discharge status: Home / Self Care | Payer: BLUE CROSS/BLUE SHIELD | Source: Ambulatory Visit | Attending: Vascular Surgery | Admitting: Vascular Surgery

## 2017-07-30 DIAGNOSIS — I10 Essential (primary) hypertension: Secondary | ICD-10-CM | POA: Diagnosis not present

## 2017-07-30 DIAGNOSIS — I77810 Thoracic aortic ectasia: Secondary | ICD-10-CM | POA: Insufficient documentation

## 2017-07-30 DIAGNOSIS — Z87891 Personal history of nicotine dependence: Secondary | ICD-10-CM | POA: Diagnosis not present

## 2017-07-30 DIAGNOSIS — Z136 Encounter for screening for cardiovascular disorders: Secondary | ICD-10-CM | POA: Diagnosis not present

## 2017-07-30 DIAGNOSIS — E7800 Pure hypercholesterolemia, unspecified: Secondary | ICD-10-CM | POA: Insufficient documentation

## 2017-08-04 DIAGNOSIS — G8929 Other chronic pain: Secondary | ICD-10-CM | POA: Diagnosis not present

## 2017-08-04 DIAGNOSIS — Z79899 Other long term (current) drug therapy: Secondary | ICD-10-CM | POA: Diagnosis not present

## 2017-08-04 DIAGNOSIS — M25562 Pain in left knee: Secondary | ICD-10-CM | POA: Diagnosis not present

## 2017-08-06 ENCOUNTER — Encounter: Payer: Self-pay | Admitting: Family Medicine

## 2017-08-06 ENCOUNTER — Telehealth: Payer: Self-pay | Admitting: Family Medicine

## 2017-08-06 NOTE — Telephone Encounter (Signed)
Copied from CRM [id number]. Topic: Quick Communication - See Telephone Encounter >> Aug 06, 2017  3:13 PM Oneal Grout wrote: CRM for notification. See Telephone encounter for: 08/06/17. Patient seen over the weekend at [facility], had cortisone injection in left knee. Would like to know how long the swelling and pain be present? Should he have something for inflammation?

## 2017-08-08 NOTE — Telephone Encounter (Signed)
Pt advised to contact [facility] or schedule an OV with [place]. He stated he would give them a call. He states swelling and pain have improved since injection.

## 2017-08-11 DIAGNOSIS — M25562 Pain in left knee: Secondary | ICD-10-CM | POA: Diagnosis not present

## 2017-08-11 DIAGNOSIS — G8929 Other chronic pain: Secondary | ICD-10-CM | POA: Diagnosis not present

## 2017-08-11 DIAGNOSIS — Z79899 Other long term (current) drug therapy: Secondary | ICD-10-CM | POA: Diagnosis not present

## 2018-01-15 NOTE — Progress Notes (Signed)
Subjective:    Patient ID: Tyler Vasquez; male   DOB: 1950/12/07; 67 y.o.   MRN: [id number]  Chief Complaint  Patient presents with  . Annual Exam    HPI Primary Preventative Screenings: Prostate Cancer: following with Dr. Lenn Sink - has appt sched STI screening: neg hep C screen 11/2016 Colorectal Cancer: agreed to cologuard last yr but never submitted - however, now covered under insurance so will do - reordered as prior order expired Tobacco use/AAA/Lung/EtOH/Illicit substances:  Cardiac: Had AAA screen with dilation in prox aorta but not meed criteria for aneurysm Weight/Blood sugar/Diet/Exercise: BMI Readings from Last 3 Encounters:  07/13/17 29.38 kg/m  03/20/17 29.41 kg/m  12/01/16 29.60 kg/m   No results found for: HGBA1C OTC/Vit/Supp/Herbal: mvi but not asa. Will start a baby asa. Dentist/Optho: Immunizations:  Immunization History  Administered Date(s) Administered  . Influenza Split 02/13/2012  . Influenza,inj,Quad PF,6+ Mos 11/20/2014, 11/24/2015, 12/01/2016  . Pneumococcal Conjugate-13 12/01/2016  . Tdap 02/13/2012    Chronic Medical Conditions: HTN: No problem w meds.  No prob w compliance. Taking every day. Current regimen working very well - BP well controlled and feeling well. HLD:  Eye doctor told him that something was closing or not as wide as it should be  - was seen [facility] in [place]. Rec eye surgeon due laser on his eyes to open it up. He has an appointment w/ specialist sched on 12/4 in [place] also.   C/o occ stomach pain - asked for med so coffee doesn't tear his stomach up but prev rx'd med helped relieve it.  Occ cough which is treated with benzonatate.   Hit knee a week ago and pain is still lingering   Medical History: Past Medical History:  Diagnosis Date  . Abnormal prostate biopsy 04/01/2015   by Dr. Retta Diones. Pt refuses to f/u due recurrent biopsies since 2008 w/o clear progression of disease  . Hep C w/o coma,  chronic (HCC) 1999  . HSV-2 (herpes simplex virus 2) infection   . Hyperlipidemia   . Hypertension   . PPD positive 1999  . Syphili, latent 1999  . Tuberculosis 1999   No past surgical history on file. Current Outpatient Medications on File Prior to Visit  Medication Sig Dispense Refill  . Alprostadil (PROSTAGLANDIN E1) POWD 40 mcg/mL by Intracavernosal route daily as needed (erectile dysfunction). May use up to 1 ml (40 mcg)/dose.    Marland Kitchen aspirin EC 81 MG tablet Take 1 tablet (81 mg total) by mouth daily.    Marland Kitchen atorvastatin (LIPITOR) 40 MG tablet Take 1 tablet (40 mg total) by mouth daily at 6 PM. 90 tablet 3  . Multiple Vitamin (MULTIVITAMIN WITH MINERALS) TABS tablet Take 1 tablet by mouth daily.    . Olmesartan-amLODIPine-HCTZ 40-5-25 MG TABS Take 1 tablet by mouth daily. 90 tablet 3   No current facility-administered medications on file prior to visit.    No Known Allergies Family History  Problem Relation Age of Onset  . Hypertension Mother   . Alcohol abuse Father   . Alcohol abuse Other   . Hypertension Other   . Cancer Neg Hx   . Diabetes Neg Hx   . Early death Neg Hx   . Heart disease Neg Hx   . Hyperlipidemia Neg Hx   . Kidney disease Neg Hx   . Stroke Neg Hx    Social History   Socioeconomic History  . Marital status: Single    Spouse name: Not on file  .  Number of children: Not on file  . Years of education: Not on file  . Highest education level: Not on file  Occupational History  . Not on file  Social Needs  . Financial resource strain: Not on file  . Food insecurity:    Worry: Not on file    Inability: Not on file  . Transportation needs:    Medical: Not on file    Non-medical: Not on file  Tobacco Use  . Smoking status: Former Games developer  . Smokeless tobacco: Never Used  Substance and Sexual Activity  . Alcohol use: No  . Drug use: No  . Sexual activity: Yes  Lifestyle  . Physical activity:    Days per week: Not on file    Minutes per session:  Not on file  . Stress: Not on file  Relationships  . Social connections:    Talks on phone: Not on file    Gets together: Not on file    Attends religious service: Not on file    Active member of club or organization: Not on file    Attends meetings of clubs or organizations: Not on file    Relationship status: Not on file  Other Topics Concern  . Not on file  Social History Narrative   served a 16 year prison term for vehicular manslaughter   Depression screen [facility] 2/9 01/16/2018 07/13/2017 11/16/2016 07/09/2016 03/16/2016  Decreased Interest 0 0 0 0 0  Down, Depressed, Hopeless 0 0 0 0 0  PHQ - 2 Score 0 0 0 0 0     Review of Systems  All other systems reviewed and are negative. Unless otherwise noted in HPI     Objective:  BP 126/70   Pulse 70   Temp 98 F (36.7 C) (Oral)   Resp 16   Ht 5' 8.31" (1.735 m)   Wt 188 lb (85.3 kg)   SpO2 95%   BMI 28.33 kg/m   Visual Acuity Screening   Right eye Left eye Both eyes  Without correction: 20/25 20/20 20/20   With correction:      Physical Exam  Constitutional: He is oriented to person, place, and time. He appears well-developed and well-nourished. No distress.  HENT:  Head: Normocephalic and atraumatic.  Right Ear: Tympanic membrane, external ear and ear canal normal.  Left Ear: Tympanic membrane, external ear and ear canal normal.  Nose: Nose normal.  Mouth/Throat: Uvula is midline, oropharynx is clear and moist and mucous membranes are normal. No oropharyngeal exudate.  Eyes: Conjunctivae are normal. Right eye exhibits no discharge. Left eye exhibits no discharge. No scleral icterus.  Neck: Normal range of motion. Neck supple. No thyromegaly present.  Cardiovascular: Normal rate, regular rhythm, normal heart sounds and intact distal pulses.  Pulmonary/Chest: Effort normal and breath sounds normal. No respiratory distress.  Abdominal: Soft. Bowel sounds are normal. He exhibits no distension and no mass. There is no  tenderness. There is no rebound and no guarding.  Genitourinary: Rectum normal. Prostate is enlarged. Prostate is not tender.  Musculoskeletal: He exhibits no edema.  Lymphadenopathy:    He has no cervical adenopathy.  Neurological: He is alert and oriented to person, place, and time. He has normal reflexes. He displays normal reflexes. No cranial nerve deficit. He exhibits normal muscle tone.  Skin: Skin is warm and dry. No rash noted. He is not diaphoretic. No erythema.  Psychiatric: He has a normal mood and affect. His behavior is normal.   EKG:  NSR, no acute ischemic changes noted. When compared to prior EKG done 11/20/2014, there does appear to be the development of Right atrial enlargement.   I have personally reviewed the EKG tracing and agree with the computer interpretation. Sinus  Rhythm  WITHIN NORMAL LIMITS HR 61, PR 136 POC TESTING Office Visit on 01/16/2018  Component Date Value Ref Range Status  . WBC 01/16/2018 5.0  3.4 - 10.8 x10E3/uL Final  . RBC 01/16/2018 4.44  4.14 - 5.80 x10E6/uL Final  . Hemoglobin 01/16/2018 14.4  13.0 - 17.7 g/dL Final  . Hematocrit 44/05/4740 41.9  37.5 - 51.0 % Final  . MCV 01/16/2018 94  79 - 97 fL Final  . MCH 01/16/2018 32.4  26.6 - 33.0 pg Final  . MCHC 01/16/2018 34.4  31.5 - 35.7 g/dL Final  . RDW 59/56/3875 12.2* 12.3 - 15.4 % Final  . Platelets 01/16/2018 310  150 - 450 x10E3/uL Final  . Neutrophils 01/16/2018 43  Not Estab. % Final  . Lymphs 01/16/2018 43  Not Estab. % Final  . Monocytes 01/16/2018 10  Not Estab. % Final  . Eos 01/16/2018 3  Not Estab. % Final  . Basos 01/16/2018 1  Not Estab. % Final  . Neutrophils Absolute 01/16/2018 2.2  1.4 - 7.0 x10E3/uL Final  . Lymphocytes Absolute 01/16/2018 2.2  0.7 - 3.1 x10E3/uL Final  . Monocytes Absolute 01/16/2018 0.5  0.1 - 0.9 x10E3/uL Final  . EOS (ABSOLUTE) 01/16/2018 0.1  0.0 - 0.4 x10E3/uL Final  . Basophils Absolute 01/16/2018 0.0  0.0 - 0.2 x10E3/uL Final  . Immature  Granulocytes 01/16/2018 0  Not Estab. % Final  . Immature Grans (Abs) 01/16/2018 0.0  0.0 - 0.1 x10E3/uL Final  . Glucose 01/16/2018 91  65 - 99 mg/dL Final  . BUN 64/33/2951 17  8 - 27 mg/dL Final  . Creatinine, Ser 01/16/2018 1.38* 0.76 - 1.27 mg/dL Final  . GFR calc non Af Amer 01/16/2018 53* >59 mL/min/1.73 Final  . GFR calc Af Amer 01/16/2018 61  >59 mL/min/1.73 Final  . BUN/Creatinine Ratio 01/16/2018 12  10 - 24 Final  . Sodium 01/16/2018 137  134 - 144 mmol/L Final  . Potassium 01/16/2018 4.9  3.5 - 5.2 mmol/L Final  . Chloride 01/16/2018 100  96 - 106 mmol/L Final  . CO2 01/16/2018 24  20 - 29 mmol/L Final  . Calcium 01/16/2018 9.5  8.6 - 10.2 mg/dL Final  . Total Protein 01/16/2018 7.2  6.0 - 8.5 g/dL Final  . Albumin 88/41/6606 4.1  3.6 - 4.8 g/dL Final  . Globulin, Total 01/16/2018 3.1  1.5 - 4.5 g/dL Final  . Albumin/Globulin Ratio 01/16/2018 1.3  1.2 - 2.2 Final  . Bilirubin Total 01/16/2018 0.5  0.0 - 1.2 mg/dL Final  . Alkaline Phosphatase 01/16/2018 88  39 - 117 IU/L Final  . AST 01/16/2018 28  0 - 40 IU/L Final  . ALT 01/16/2018 27  0 - 44 IU/L Final  . Cholesterol, Total 01/16/2018 120  100 - 199 mg/dL Final  . Triglycerides 01/16/2018 130  0 - 149 mg/dL Final  . HDL 30/16/0109 33* >39 mg/dL Final  . VLDL Cholesterol Cal 01/16/2018 26  5 - 40 mg/dL Final  . LDL Calculated 01/16/2018 61  0 - 99 mg/dL Final  . Chol/HDL Ratio 01/16/2018 3.6  0.0 - 5.0 ratio Final   Comment:  T. Chol/HDL Ratio                                             Men  Women                               1/2 Avg.Risk  3.4    3.3                                   Avg.Risk  5.0    4.4                                2X Avg.Risk  9.6    7.1                                3X Avg.Risk 23.4   11.0   . Color, UA 01/16/2018 yellow  yellow Final  . Clarity, UA 01/16/2018 clear  clear Final  . Glucose, UA 01/16/2018 negative  negative mg/dL Final  . Bilirubin, UA  01/16/2018 negative  negative Final  . Ketones, POC UA 01/16/2018 negative  negative mg/dL Final  . Spec Grav, UA 01/16/2018 1.015  1.010 - 1.025 Final  . Blood, UA 01/16/2018 negative  negative Final  . pH, UA 01/16/2018 7.0  5.0 - 8.0 Final  . Protein Ur, POC 01/16/2018 negative  negative mg/dL Final  . Urobilinogen, UA 01/16/2018 0.2  0.2 or 1.0 E.U./dL Final  . Nitrite, UA 09/81/1914 Negative  Negative Final  . Leukocytes, UA 01/16/2018 Negative  Negative Final  . TSH 01/16/2018 1.080  0.450 - 4.500 uIU/mL Final     Assessment & Plan:   1. Annual physical exam   2. Screening for colorectal cancer   3. Essential hypertension, benign - continue olmesartan-amlodipine-hctz 40-5-25 - very well controlled. BP Readings from Last 3 Encounters:  01/16/18 126/70  07/13/17 122/60  03/20/17 133/84    4. Erectile dysfunction, unspecified erectile dysfunction type - cont to follow every 6 mos at minimum with urology.  5. Pure hypercholesterolemia - continue atorvastatin 40  Lab Results  Component Value Date   LDLCALC 61 01/16/2018   LDLCALC 113 (H) 12/01/2016   LDLCALC 141 (H) 11/24/2015    6. Gastritis and gastroduodenitis - uses rare ppi - ok to cont, esp if using nsaid  7. Acute pain of left knee - ok to cont rare ec nsaid, wear knee pads while on job  8. 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    Patient will continue on current chronic medications other than changes noted above, so ok to refill when needed.   Reviewed all health maintenance recommendations per USPSTF guidelines.   See after visit summary for patient specific instructions.  Orders Placed This Encounter  Procedures  . Flu Vaccine QUAD 36+ mos IM  . Pneumococcal polysaccharide vaccine 23-valent greater than or equal to 2yo subcutaneous/IM  . CBC with Differential/Platelet  . Comprehensive metabolic panel    Order Specific  Question:   Has the patient fasted?    Answer:  No  . Lipid panel    Order Specific Question:   Has the patient fasted?    Answer:   No  . TSH  . Cologuard  . POCT urinalysis dipstick  . EKG 12-Lead    Meds ordered this encounter  Medications  . pantoprazole (PROTONIX) 40 MG tablet    Sig: Take 1 tablet (40 mg total) by mouth daily as needed. Before breakfast    Dispense:  90 tablet    Refill:  0  . benzonatate (TESSALON) 200 MG capsule    Sig: Take 1 capsule (200 mg total) by mouth 3 (three) times daily as needed for cough.    Dispense:  30 capsule    Refill:  0  . diclofenac (VOLTAREN) 75 MG EC tablet    Sig: Take 1 tablet (75 mg total) by mouth 2 (two) times daily as needed (joint inflammation and pain).    Dispense:  60 tablet    Refill:  1    Patient verbalized to me that they understand the following: diagnosis, what is being done for them, what to expect and what should be done at home.  Their questions have been answered. They understand that I am unable to predict every possible medication interaction or adverse outcome and that if any unexpected symptoms arise, they should contact us and their pharmacist, as well as never hesitate to seek urgent/emergent care at [facility] or ER if they think it might be warranted.    [name], MD, MPH Primary Care at [facility] [address] [place], [place]  [address] [phone] Office phone  [phone] Office fax   01/15/18 5:46 PM

## 2018-01-16 ENCOUNTER — Encounter: Payer: Self-pay | Admitting: Family Medicine

## 2018-01-16 ENCOUNTER — Other Ambulatory Visit: Payer: Self-pay

## 2018-01-16 ENCOUNTER — Ambulatory Visit (INDEPENDENT_AMBULATORY_CARE_PROVIDER_SITE_OTHER): Payer: BLUE CROSS/BLUE SHIELD | Admitting: Family Medicine

## 2018-01-16 VITALS — BP 126/70 | HR 70 | Temp 98.0°F | Resp 16 | Ht 68.31 in | Wt 188.0 lb

## 2018-01-16 DIAGNOSIS — K2970 Gastritis, unspecified, without bleeding: Secondary | ICD-10-CM

## 2018-01-16 DIAGNOSIS — Z0001 Encounter for general adult medical examination with abnormal findings: Secondary | ICD-10-CM

## 2018-01-16 DIAGNOSIS — I10 Essential (primary) hypertension: Secondary | ICD-10-CM

## 2018-01-16 DIAGNOSIS — Z1212 Encounter for screening for malignant neoplasm of rectum: Secondary | ICD-10-CM | POA: Diagnosis not present

## 2018-01-16 DIAGNOSIS — Z1211 Encounter for screening for malignant neoplasm of colon: Secondary | ICD-10-CM

## 2018-01-16 DIAGNOSIS — R059 Cough, unspecified: Secondary | ICD-10-CM

## 2018-01-16 DIAGNOSIS — E7800 Pure hypercholesterolemia, unspecified: Secondary | ICD-10-CM | POA: Diagnosis not present

## 2018-01-16 DIAGNOSIS — N529 Male erectile dysfunction, unspecified: Secondary | ICD-10-CM | POA: Diagnosis not present

## 2018-01-16 DIAGNOSIS — Z0000 Encounter for general adult medical examination without abnormal findings: Secondary | ICD-10-CM | POA: Diagnosis not present

## 2018-01-16 DIAGNOSIS — R05 Cough: Secondary | ICD-10-CM

## 2018-01-16 DIAGNOSIS — Z23 Encounter for immunization: Secondary | ICD-10-CM

## 2018-01-16 DIAGNOSIS — K2990 Gastroduodenitis, unspecified, without bleeding: Secondary | ICD-10-CM

## 2018-01-16 DIAGNOSIS — M25562 Pain in left knee: Secondary | ICD-10-CM

## 2018-01-16 LAB — POCT URINALYSIS DIP (MANUAL ENTRY)
BILIRUBIN UA: NEGATIVE mg/dL
Bilirubin, UA: NEGATIVE
Blood, UA: NEGATIVE
Glucose, UA: NEGATIVE mg/dL
LEUKOCYTES UA: NEGATIVE
Nitrite, UA: NEGATIVE
PROTEIN UA: NEGATIVE mg/dL
SPEC GRAV UA: 1.015 (ref 1.010–1.025)
Urobilinogen, UA: 0.2 E.U./dL
pH, UA: 7 (ref 5.0–8.0)

## 2018-01-16 MED ORDER — DICLOFENAC SODIUM 75 MG PO TBEC: 75.0000 mg | DELAYED_RELEASE_TABLET | Freq: Two times a day (BID) | ORAL | 1 refills | Status: DC | PRN

## 2018-01-16 MED ORDER — PANTOPRAZOLE SODIUM 40 MG PO TBEC: 40.0000 mg | DELAYED_RELEASE_TABLET | Freq: Every day | ORAL | 0 refills | Status: DC | PRN

## 2018-01-16 MED ORDER — BENZONATATE 200 MG PO CAPS: 200.0000 mg | ORAL_CAPSULE | Freq: Three times a day (TID) | ORAL | 0 refills | Status: DC | PRN

## 2018-01-16 NOTE — Patient Instructions (Addendum)
Have copies of your eye doctor exam sent to [place]. Change to low acid coffee.  If you have lab work done today you will be contacted with your lab results within the next 2 weeks.  If you have not heard from [place] then please contact us. The fastest way to get your results is to register for My Chart.   IF you received an x-ray today, you will receive an invoice from [facility]. Please contact [facility] at [phone] with questions or concerns regarding your invoice.   IF you received labwork today, you will receive an invoice from [facility]. Please contact [facility] at [phone] with questions or concerns regarding your invoice.   Our billing staff will not be able to assist you with questions regarding bills from these companies.  You will be contacted with the lab results as soon as they are available. The fastest way to get your results is to activate your My Chart account. Instructions are located on the last page of this paperwork. If you have not heard from [place] regarding the results in 2 weeks, please contact this office.     Food Choices for Peptic Ulcer Disease When you have peptic ulcer disease, the foods you eat and your eating habits are very important. Choosing the right foods can help ease the discomfort of peptic ulcer disease. What general guidelines do I need to follow?  Choose fruits, vegetables, whole grains, and low-fat meat, fish, and poultry.  Keep a food diary to identify foods that cause symptoms.  Avoid foods that cause irritation or pain. These may be different for different people.  Eat frequent small meals instead of three large meals each day. The pain may be worse when your stomach is empty.  Avoid eating close to bedtime. What foods are not recommended? The following are some foods and drinks that may worsen your symptoms:  Black, white, and red pepper.  Hot sauce.  Chili peppers.  Chili powder.  Chocolate and  cocoa.  Alcohol.  Tea, coffee, and cola (regular and decaffeinated).  The items listed above may not be a complete list of foods and beverages to avoid. Contact your dietitian for more information. This information is not intended to replace advice given to you by your health care provider. Make sure you discuss any questions you have with your health care provider. Document Released: 05/21/2011 Document Revised: 08/04/2015 Document Reviewed: 12/31/2012 Elsevier Interactive Patient Education  2017 Elsevier Inc.  Heart-Healthy Eating Plan Heart-healthy meal planning includes:  Limiting unhealthy fats.  Increasing healthy fats.  Making other small dietary changes.  You may need to talk with your doctor or a diet specialist (dietitian) to create an eating plan that is right for you. What types of fat should I choose?  Choose healthy fats. These include olive oil and canola oil, flaxseeds, walnuts, almonds, and seeds.  Eat more omega-3 fats. These include salmon, mackerel, sardines, tuna, flaxseed oil, and ground flaxseeds. Try to eat fish at least twice each week.  Limit saturated fats. ? Saturated fats are often found in animal products, such as meats, butter, and cream. ? Plant sources of saturated fats include palm oil, palm kernel oil, and coconut oil.  Avoid foods with partially hydrogenated oils in them. These include stick margarine, some tub margarines, cookies, crackers, and other baked goods. These contain trans fats. What general guidelines do I need to follow?  Check food labels carefully. Identify foods with trans fats or high amounts of saturated fat.  Fill one half of your plate with vegetables and green salads. Eat 4-5 servings of vegetables per day. A serving of vegetables is: ? 1 cup of raw leafy vegetables. ?  cup of raw or cooked cut-up vegetables. ?  cup of vegetable juice.  Fill one fourth of your plate with whole grains. Look for the word "whole" as the  first word in the ingredient list.  Fill one fourth of your plate with lean protein foods.  Eat 4-5 servings of fruit per day. A serving of fruit is: ? One medium whole fruit. ?  cup of dried fruit. ?  cup of fresh, frozen, or canned fruit. ?  cup of 100% fruit juice.  Eat more foods that contain soluble fiber. These include apples, broccoli, carrots, beans, peas, and barley. Try to get 20-30 g of fiber per day.  Eat more home-cooked food. Eat less restaurant, buffet, and fast food.  Limit or avoid alcohol.  Limit foods high in starch and sugar.  Avoid fried foods.  Avoid frying your food. Try baking, boiling, grilling, or broiling it instead. You can also reduce fat by: ? Removing the skin from poultry. ? Removing all visible fats from meats. ? Skimming the fat off of stews, soups, and gravies before serving them. ? Steaming vegetables in water or broth.  Lose weight if you are overweight.  Eat 4-5 servings of nuts, legumes, and seeds per week: ? One serving of dried beans or legumes equals  cup after being cooked. ? One serving of nuts equals 1 ounces. ? One serving of seeds equals  ounce or one tablespoon.  You may need to keep track of how much salt or sodium you eat. This is especially true if you have high blood pressure. Talk with your doctor or dietitian to get more information. What foods can I eat? Grains Breads, including [place], white, pita, wheat, raisin, rye, oatmeal, and [place]. Tortillas that are neither fried nor made with lard or trans fat. Low-fat rolls, including hotdog and hamburger buns and English muffins. Biscuits. Muffins. Waffles. Pancakes. Light popcorn. Whole-grain cereals. Flatbread. Melba toast. Pretzels. Breadsticks. Rusks. Low-fat snacks. Low-fat crackers, including oyster, saltine, matzo, graham, animal, and rye. Rice and pasta, including brown rice and pastas that are made with whole wheat. Vegetables All vegetables. Fruits All fruits,  but limit coconut. Meats and Other Protein Sources Lean, well-trimmed beef, veal, pork, and lamb. Chicken and [place] without skin. All fish and shellfish. Wild duck, rabbit, pheasant, and venison. Egg whites or low-cholesterol egg substitutes. Dried beans, peas, lentils, and tofu. Seeds and most nuts. Dairy Low-fat or nonfat cheeses, including ricotta, string, and mozzarella. Skim or 1% milk that is liquid, powdered, or evaporated. Buttermilk that is made with low-fat milk. Nonfat or low-fat yogurt. Beverages Mineral water. Diet carbonated beverages. Sweets and Desserts Sherbets and fruit ices. Honey, jam, marmalade, jelly, and syrups. Meringues and gelatins. Pure sugar candy, such as hard candy, jelly beans, gumdrops, mints, marshmallows, and small amounts of dark chocolate. [employer]. Eat all sweets and desserts in moderation. Fats and Oils Nonhydrogenated (trans-free) margarines. Vegetable oils, including soybean, sesame, sunflower, olive, peanut, safflower, corn, canola, and cottonseed. Salad dressings or mayonnaise made with a vegetable oil. Limit added fats and oils that you use for cooking, baking, salads, and as spreads. Other Cocoa powder. Coffee and tea. All seasonings and condiments. The items listed above may not be a complete list of recommended foods or beverages. Contact your dietitian for more options.  What foods are not recommended? Grains Breads that are made with saturated or trans fats, oils, or whole milk. Croissants. Butter rolls. Cheese breads. Sweet rolls. Donuts. Buttered popcorn. Chow mein noodles. High-fat crackers, such as cheese or butter crackers. Meats and Other Protein Sources Fatty meats, such as hotdogs, short ribs, sausage, spareribs, bacon, rib eye roast or steak, and mutton. High-fat deli meats, such as salami and bologna. Caviar. Domestic duck and goose. Organ meats, such as kidney, liver, sweetbreads, and heart. Dairy Cream, sour cream, cream cheese,  and creamed cottage cheese. Whole-milk cheeses, including blue (bleu), [address], [place], [employer], [address], [place], [address], cheddar, [place], and [place]. Whole or 2% milk that is liquid, evaporated, or condensed. Whole buttermilk. Cream sauce or high-fat cheese sauce. Yogurt that is made from whole milk. Beverages Regular sodas and juice drinks with added sugar. Sweets and Desserts Frosting. Pudding. Cookies. Cakes other than angel food cake. Candy that has milk chocolate or white chocolate, hydrogenated fat, butter, coconut, or unknown ingredients. Buttered syrups. Full-fat ice cream or ice cream drinks. Fats and Oils Gravy that has suet, meat fat, or shortening. Cocoa butter, hydrogenated oils, palm oil, coconut oil, palm kernel oil. These can often be found in baked products, candy, fried foods, nondairy creamers, and whipped toppings. Solid fats and shortenings, including bacon fat, salt pork, lard, and butter. Nondairy cream substitutes, such as coffee creamers and sour cream substitutes. Salad dressings that are made of unknown oils, cheese, or sour cream. The items listed above may not be a complete list of foods and beverages to avoid. Contact your dietitian for more information. This information is not intended to replace advice given to you by your health care provider. Make sure you discuss any questions you have with your health care provider. Document Released: 08/28/2011 Document Revised: 08/04/2015 Document Reviewed: 08/20/2013 Elsevier Interactive Patient Education  [employer].

## 2018-01-17 LAB — COMPREHENSIVE METABOLIC PANEL
A/G RATIO: 1.3 (ref 1.2–2.2)
ALBUMIN: 4.1 g/dL (ref 3.6–4.8)
ALT: 27 IU/L (ref 0–44)
AST: 28 IU/L (ref 0–40)
Alkaline Phosphatase: 88 IU/L (ref 39–117)
BILIRUBIN TOTAL: 0.5 mg/dL (ref 0.0–1.2)
BUN / CREAT RATIO: 12 (ref 10–24)
BUN: 17 mg/dL (ref 8–27)
CHLORIDE: 100 mmol/L (ref 96–106)
CO2: 24 mmol/L (ref 20–29)
Calcium: 9.5 mg/dL (ref 8.6–10.2)
Creatinine, Ser: 1.38 mg/dL — ABNORMAL HIGH (ref 0.76–1.27)
GFR calc non Af Amer: 53 mL/min/{1.73_m2} — ABNORMAL LOW (ref >59)
GFR, EST AFRICAN AMERICAN: 61 mL/min/{1.73_m2} (ref >59)
GLOBULIN, TOTAL: 3.1 g/dL (ref 1.5–4.5)
Glucose: 91 mg/dL (ref 65–99)
Potassium: 4.9 mmol/L (ref 3.5–5.2)
SODIUM: 137 mmol/L (ref 134–144)
Total Protein: 7.2 g/dL (ref 6.0–8.5)

## 2018-01-17 LAB — LIPID PANEL
CHOLESTEROL TOTAL: 120 mg/dL (ref 100–199)
Chol/HDL Ratio: 3.6 ratio (ref 0.0–5.0)
HDL: 33 mg/dL — AB (ref >39)
LDL Calculated: 61 mg/dL (ref 0–99)
TRIGLYCERIDES: 130 mg/dL (ref 0–149)
VLDL CHOLESTEROL CAL: 26 mg/dL (ref 5–40)

## 2018-01-17 LAB — CBC WITH DIFFERENTIAL/PLATELET
BASOS ABS: 0 10*3/uL (ref 0.0–0.2)
BASOS: 1 %
EOS (ABSOLUTE): 0.1 10*3/uL (ref 0.0–0.4)
EOS: 3 %
HEMATOCRIT: 41.9 % (ref 37.5–51.0)
HEMOGLOBIN: 14.4 g/dL (ref 13.0–17.7)
Immature Grans (Abs): 0 10*3/uL (ref 0.0–0.1)
Immature Granulocytes: 0 %
LYMPHS ABS: 2.2 10*3/uL (ref 0.7–3.1)
Lymphs: 43 %
MCH: 32.4 pg (ref 26.6–33.0)
MCHC: 34.4 g/dL (ref 31.5–35.7)
MCV: 94 fL (ref 79–97)
Monocytes Absolute: 0.5 10*3/uL (ref 0.1–0.9)
Monocytes: 10 %
NEUTROS ABS: 2.2 10*3/uL (ref 1.4–7.0)
Neutrophils: 43 %
Platelets: 310 10*3/uL (ref 150–450)
RBC: 4.44 x10E6/uL (ref 4.14–5.80)
RDW: 12.2 % — ABNORMAL LOW (ref 12.3–15.4)
WBC: 5 10*3/uL (ref 3.4–10.8)

## 2018-01-17 LAB — TSH: TSH: 1.08 u[IU]/mL (ref 0.450–4.500)

## 2018-01-19 ENCOUNTER — Other Ambulatory Visit: Payer: Self-pay | Admitting: Family Medicine

## 2018-01-20 ENCOUNTER — Telehealth: Payer: Self-pay | Admitting: *Deleted

## 2018-01-20 ENCOUNTER — Ambulatory Visit: Payer: Self-pay

## 2018-01-20 NOTE — Telephone Encounter (Signed)
Patient called, left VM to return call to the office to discuss requested med refill. This med Losartan-HCTZ was discontinued by provide on 12/01/16 and changed to Olmesartan-amlodipine-HCTZ.   [facility] called and spoke to [employer], Pharmacologist and advised patient is no longer taking Losartan-HCTZ and is taking Olmesartan-amlodipine-HCTZ, he says he will refuse it and the Olmesartan is ready for pick up.

## 2018-01-20 NOTE — Telephone Encounter (Signed)
TC from patient after VM from [place]. Patient is aware of which medication he is taking,  Olmesartan-amlodipine-HCTZ 40-5-25 MG tabs. Answered all questions. Is aware a refill is ready for pick up.

## 2018-01-20 NOTE — Telephone Encounter (Signed)
Left message for return call.

## 2018-02-03 ENCOUNTER — Encounter: Payer: Self-pay | Admitting: Family Medicine

## 2018-02-12 DIAGNOSIS — H40033 Anatomical narrow angle, bilateral: Secondary | ICD-10-CM | POA: Diagnosis not present

## 2018-02-12 DIAGNOSIS — H2513 Age-related nuclear cataract, bilateral: Secondary | ICD-10-CM | POA: Diagnosis not present

## 2018-02-20 DIAGNOSIS — H40033 Anatomical narrow angle, bilateral: Secondary | ICD-10-CM | POA: Diagnosis not present

## 2018-02-23 DIAGNOSIS — M25562 Pain in left knee: Secondary | ICD-10-CM | POA: Diagnosis not present

## 2018-02-23 DIAGNOSIS — M79651 Pain in right thigh: Secondary | ICD-10-CM | POA: Diagnosis not present

## 2018-02-23 DIAGNOSIS — G8929 Other chronic pain: Secondary | ICD-10-CM | POA: Diagnosis not present

## 2018-02-23 DIAGNOSIS — S8011XA Contusion of right lower leg, initial encounter: Secondary | ICD-10-CM | POA: Diagnosis not present

## 2018-02-23 DIAGNOSIS — Z79899 Other long term (current) drug therapy: Secondary | ICD-10-CM | POA: Diagnosis not present

## 2018-02-27 DIAGNOSIS — H40033 Anatomical narrow angle, bilateral: Secondary | ICD-10-CM | POA: Diagnosis not present

## 2018-03-16 DIAGNOSIS — M79651 Pain in right thigh: Secondary | ICD-10-CM | POA: Diagnosis not present

## 2018-03-16 DIAGNOSIS — I10 Essential (primary) hypertension: Secondary | ICD-10-CM | POA: Diagnosis not present

## 2018-03-18 DIAGNOSIS — H40033 Anatomical narrow angle, bilateral: Secondary | ICD-10-CM | POA: Diagnosis not present

## 2018-03-28 ENCOUNTER — Other Ambulatory Visit: Payer: Self-pay | Admitting: Family Medicine

## 2018-03-28 NOTE — Telephone Encounter (Signed)
Requested Prescriptions  Pending Prescriptions Disp Refills  . diclofenac (VOLTAREN) 75 MG EC tablet [Pharmacy Med Name: DICLOFENAC SODIUM 75MG  DR TABLETS] 60 tablet 1    Sig: TAKE 1 TABLET(75 MG) BY MOUTH TWICE DAILY AS NEEDED FOR JOINT INFLAMMATION OR PAIN     Analgesics:  NSAIDS Failed - 03/28/2018  9:35 AM      Failed - Cr in normal range and within 360 days    Creat  Date Value Ref Range Status  11/24/2015 1.25 0.70 - 1.25 mg/dL Final    Comment:      For patients > or = 68 years of age: The upper reference limit for Creatinine is approximately 13% higher for people identified as African-American.      Creatinine, Ser  Date Value Ref Range Status  01/16/2018 1.38 (H) 0.76 - 1.27 mg/dL Final         Passed - HGB in normal range and within 360 days    Hemoglobin  Date Value Ref Range Status  01/16/2018 14.4 13.0 - 17.7 g/dL Final         Passed - Patient is not pregnant      Passed - Valid encounter within last 12 months    Recent Outpatient Visits          2 months ago Annual physical exam   [facility], Levell July, MD   8 months ago Essential hypertension, benign   [facility], Levell July, MD   1 year ago Persistent cough for 3 weeks or longer   [facility], Marolyn Hammock, PA-C   1 year ago Annual physical exam   [facility], Levell July, MD   1 year ago Essential hypertension   [facility], Levell July, MD      Future Appointments            In 3 months Sherren Mocha, MD [facility] [place], [facility]

## 2018-04-13 DIAGNOSIS — M25512 Pain in left shoulder: Secondary | ICD-10-CM | POA: Diagnosis not present

## 2018-04-13 DIAGNOSIS — M25511 Pain in right shoulder: Secondary | ICD-10-CM | POA: Diagnosis not present

## 2018-04-14 ENCOUNTER — Other Ambulatory Visit: Payer: Self-pay | Admitting: Family Medicine

## 2018-05-13 ENCOUNTER — Telehealth: Payer: Self-pay | Admitting: Family Medicine

## 2018-05-13 NOTE — Telephone Encounter (Signed)
mychart message sent to pt about their appointment with Dr Shaw °

## 2018-05-27 ENCOUNTER — Other Ambulatory Visit: Payer: Self-pay | Admitting: Family Medicine

## 2018-06-24 ENCOUNTER — Other Ambulatory Visit: Payer: Self-pay

## 2018-06-24 ENCOUNTER — Encounter: Payer: Self-pay | Admitting: Emergency Medicine

## 2018-06-24 ENCOUNTER — Telehealth: Payer: Self-pay | Admitting: Emergency Medicine

## 2018-06-24 ENCOUNTER — Telehealth (INDEPENDENT_AMBULATORY_CARE_PROVIDER_SITE_OTHER): Payer: BC Managed Care – PPO | Admitting: Emergency Medicine

## 2018-06-24 DIAGNOSIS — I10 Essential (primary) hypertension: Secondary | ICD-10-CM

## 2018-06-24 DIAGNOSIS — E7800 Pure hypercholesterolemia, unspecified: Secondary | ICD-10-CM

## 2018-06-24 MED ORDER — OLMESARTAN-AMLODIPINE-HCTZ 40-5-25 MG PO TABS: 1.0000 | ORAL_TABLET | Freq: Every day | ORAL | 3 refills | Status: DC

## 2018-06-24 MED ORDER — ATORVASTATIN CALCIUM 40 MG PO TABS: 40.0000 mg | ORAL_TABLET | Freq: Every day | ORAL | 3 refills | Status: DC

## 2018-06-24 NOTE — Progress Notes (Signed)
Called patient to triage for Telemed appointment. Patient wants to establish care, Dr Brigitte Pulse was his doctor. Patient has hypertension and cholesterol and needs medication refill on Atorvastatin and Olmesartan-amlodipine-hctz. Per patient he did not get the [employer] and he has a new insurance. He will check to see if the insurance will pay for test.

## 2018-06-24 NOTE — Assessment & Plan Note (Signed)
Well-controlled.  Continue present medication.

## 2018-06-24 NOTE — Progress Notes (Signed)
Telemedicine Encounter- SOAP NOTE Established Patient  This telephone encounter was conducted with the patient's (or proxy's) verbal consent via audio telecommunications: yes/no: Yes Patient was instructed to have this encounter in a suitably private space; and to only have persons present to whom they give permission to participate. In addition, patient identity was confirmed by use of name plus two identifiers (DOB and address).  I discussed the limitations, risks, security and privacy concerns of performing an evaluation and management service by telephone and the availability of in person appointments. I also discussed with the patient that there may be a patient responsible charge related to this service. The patient expressed understanding and agreed to proceed.  I spent a total of TIME; 0 MIN TO 60 MIN: 15 minutes talking with the patient or their proxy.  No chief complaint on file. Follow-up on high blood pressure and high cholesterol.  Subjective   Tyler Vasquez is a 68 y.o. male established patient.  He is to see Dr. Clelia CroftShaw.  First visit with me.  Telephone visit today to establish care and follow-up of hypertension and high cholesterol.  Average blood pressure at home 113/74.  Has no complaints or medical concerns today.  Needs medication refills. BP Readings from Last 3 Encounters:  01/16/18 126/70  07/13/17 122/60  03/20/17 133/84     HPI   Patient Active Problem List   Diagnosis Date Noted  . PSA elevation 04/16/2012  . PPD positive 04/16/2012  . Essential hypertension, benign 02/13/2012  . Pure hypercholesterolemia 02/13/2012  . ED (erectile dysfunction) 02/13/2012    Past Medical History:  Diagnosis Date  . Abnormal prostate biopsy 04/01/2015   by Dr. Retta Dionesahlstedt. Pt refuses to f/u due recurrent biopsies since 2008 w/o clear progression of disease  . Hep C w/o coma, chronic (HCC) 1999  . HSV-2 (herpes simplex virus 2) infection   . Hyperlipidemia   .  Hypertension   . PPD positive 1999  . Syphili, latent 1999  . Tuberculosis 1999    Current Outpatient Medications  Medication Sig Dispense Refill  . Alprostadil (PROSTAGLANDIN E1) POWD 40 mcg/mL by Intracavernosal route daily as needed (erectile dysfunction). May use up to 1 ml (40 mcg)/dose.    Marland Kitchen. atorvastatin (LIPITOR) 40 MG tablet Take 1 tablet (40 mg total) by mouth daily at 6 PM. 90 tablet 3  . benzonatate (TESSALON) 200 MG capsule Take 1 capsule (200 mg total) by mouth 3 (three) times daily as needed for cough. 30 capsule 0  . diclofenac (VOLTAREN) 75 MG EC tablet TAKE 1 TABLET(75 MG) BY MOUTH TWICE DAILY AS NEEDED FOR JOINT INFLAMMATION OR PAIN 60 tablet 0  . Multiple Vitamin (MULTIVITAMIN WITH MINERALS) TABS tablet Take 1 tablet by mouth daily.    . Olmesartan-amLODIPine-HCTZ 40-5-25 MG TABS Take 1 tablet by mouth daily. 90 tablet 3  . pantoprazole (PROTONIX) 40 MG tablet TAKE 1 TABLET(40 MG) BY MOUTH DAILY BEFORE BREAKFAST AS NEEDED 90 tablet 0  . aspirin EC 81 MG tablet Take 1 tablet (81 mg total) by mouth daily. (Patient not taking: Reported on 06/24/2018)     No current facility-administered medications for this visit.     No Known Allergies  Social History   Socioeconomic History  . Marital status: Married    Spouse name: Not on file  . Number of children: Not on file  . Years of education: Not on file  . Highest education level: Associate degree: occupational, Scientist, product/process developmenttechnical, or vocational program  Occupational History  .  Occupation: Personnel officer  Social Needs  . Financial resource strain: Not on file  . Food insecurity:    Worry: Not on file    Inability: Not on file  . Transportation needs:    Medical: Not on file    Non-medical: Not on file  Tobacco Use  . Smoking status: Former Games developer  . Smokeless tobacco: Never Used  Substance and Sexual Activity  . Alcohol use: No  . Drug use: No  . Sexual activity: Yes  Lifestyle  . Physical activity:    Days per week:  Not on file    Minutes per session: Not on file  . Stress: Not on file  Relationships  . Social connections:    Talks on phone: Not on file    Gets together: Not on file    Attends religious service: Not on file    Active member of club or organization: Not on file    Attends meetings of clubs or organizations: Not on file    Relationship status: Not on file  . Intimate partner violence:    Fear of current or ex partner: Not on file    Emotionally abused: Not on file    Physically abused: Not on file    Forced sexual activity: Not on file  Other Topics Concern  . Not on file  Social History Narrative   served a 16 year prison term for vehicular manslaughter.   Got GED and then did vocational program to become Personnel officer.   Exercises regularly.    Review of Systems  Constitutional: Negative.  Negative for chills and fever.  HENT: Negative.  Negative for congestion and sore throat.   Eyes: Negative for blurred vision and double vision.  Respiratory: Negative.  Negative for cough and shortness of breath.   Cardiovascular: Negative.  Negative for chest pain and palpitations.  Gastrointestinal: Negative.  Negative for abdominal pain, diarrhea, nausea and vomiting.  Genitourinary: Negative for dysuria and hematuria.  Musculoskeletal: Negative for myalgias.  Skin: Negative.  Negative for rash.  Neurological: Negative for tingling and headaches.  All other systems reviewed and are negative.   Objective    Vitals as reported by the patient: Self-reported blood pressure: 113/74 Awake and oriented x3 in no apparent respiratory distress. There were no vitals filed for this visit.  There are no diagnoses linked to this encounter. Diagnoses and all orders for this visit:  Essential hypertension, benign -     Olmesartan-amLODIPine-HCTZ 40-5-25 MG TABS; Take 1 tablet by mouth daily.  Pure hypercholesterolemia -     atorvastatin (LIPITOR) 40 MG tablet; Take 1 tablet (40 mg total)  by mouth daily at 6 PM.   Essential hypertension, benign Well-controlled.  Continue present medication. Clinically stable.  No medical concerns identified during today's visit. Continue present medications. Follow-up in the office in 3 months.   I discussed the assessment and treatment plan with the patient. The patient was provided an opportunity to ask questions and all were answered. The patient agreed with the plan and demonstrated an understanding of the instructions.   The patient was advised to call back or seek an in-person evaluation if the symptoms worsen or if the condition fails to improve as anticipated.  I provided 15 minutes of non-face-to-face time during this encounter.  Georgina Quint, MD  Primary [facility] at [facility]

## 2018-06-24 NOTE — Telephone Encounter (Signed)
06/24/2018 - PATIENT HAD A TELEMED VISIT WITH DR. Irving Shows ON 06/24/2018. DR. Irving Shows REQUESTED HE RETURN IN ABOUT 2 MONTHS TO TRANSFER HIS CARE (FORMERLY DR. Alver Fisher PATIENT). I HAD TO LEAVE A VOICE MAIL FOR PATIENT TO CALL BACK WHEN HE CAN. MBC

## 2018-07-17 ENCOUNTER — Ambulatory Visit: Payer: BLUE CROSS/BLUE SHIELD | Admitting: Family Medicine

## 2018-09-11 ENCOUNTER — Ambulatory Visit: Payer: Medicare Other | Admitting: Emergency Medicine

## 2018-11-21 ENCOUNTER — Other Ambulatory Visit: Payer: Self-pay | Admitting: Emergency Medicine

## 2018-11-21 NOTE — Telephone Encounter (Signed)
Requested medication (s) are due for refill today: yes  Requested medication (s) are on the active medication list: yes  Last refill:  10/20/2018  Future visit scheduled: no  Notes to clinic:  Review for refill   Requested Prescriptions  Pending Prescriptions Disp Refills   diclofenac (VOLTAREN) 75 MG EC tablet [Pharmacy Med Name: DICLOFENAC SODIUM 75MG  DR TABLETS] 60 tablet 0    Sig: TAKE 1 TABLET(75 MG) BY MOUTH TWICE DAILY AS NEEDED FOR JOINT INFLAMMATION OR PAIN     Analgesics:  NSAIDS Failed - 11/21/2018  8:07 AM      Failed - Cr in normal range and within 360 days    Creat  Date Value Ref Range Status  11/24/2015 1.25 0.70 - 1.25 mg/dL Final    Comment:      For patients > or = 68 years of age: The upper reference limit for Creatinine is approximately 13% higher for people identified as African-American.      Creatinine, Ser  Date Value Ref Range Status  01/16/2018 1.38 (H) 0.76 - 1.27 mg/dL Final         Passed - HGB in normal range and within 360 days    Hemoglobin  Date Value Ref Range Status  01/16/2018 14.4 13.0 - 17.7 g/dL Final         Passed - Patient is not pregnant      Passed - Valid encounter within last 12 months    Recent Outpatient Visits          5 months ago Essential hypertension, benign   [facility] at [facility], Ines Bloomer, MD   10 months ago Annual physical exam   [facility], Laurey Arrow, MD   1 year ago Essential hypertension, benign   [facility], Laurey Arrow, MD   1 year ago Persistent cough for 3 weeks or longer   [facility] at [place], PA-C   1 year ago Annual physical exam   [facility], Laurey Arrow, MD

## 2018-12-16 ENCOUNTER — Other Ambulatory Visit: Payer: Self-pay | Admitting: Emergency Medicine

## 2019-01-02 ENCOUNTER — Other Ambulatory Visit: Payer: Self-pay | Admitting: Emergency Medicine

## 2019-01-16 ENCOUNTER — Other Ambulatory Visit: Payer: Self-pay | Admitting: Emergency Medicine

## 2019-01-19 ENCOUNTER — Ambulatory Visit (INDEPENDENT_AMBULATORY_CARE_PROVIDER_SITE_OTHER): Payer: BC Managed Care – PPO | Admitting: Emergency Medicine

## 2019-01-19 ENCOUNTER — Encounter: Payer: Self-pay | Admitting: Emergency Medicine

## 2019-01-19 ENCOUNTER — Other Ambulatory Visit: Payer: Self-pay

## 2019-01-19 VITALS — BP 107/68 | HR 62 | Temp 98.0°F | Resp 16 | Ht 68.0 in | Wt 193.4 lb

## 2019-01-19 DIAGNOSIS — R897 Abnormal histological findings in specimens from other organs, systems and tissues: Secondary | ICD-10-CM | POA: Diagnosis not present

## 2019-01-19 DIAGNOSIS — Z23 Encounter for immunization: Secondary | ICD-10-CM

## 2019-01-19 DIAGNOSIS — Z13228 Encounter for screening for other metabolic disorders: Secondary | ICD-10-CM

## 2019-01-19 DIAGNOSIS — Z87898 Personal history of other specified conditions: Secondary | ICD-10-CM

## 2019-01-19 DIAGNOSIS — Z1211 Encounter for screening for malignant neoplasm of colon: Secondary | ICD-10-CM | POA: Diagnosis not present

## 2019-01-19 DIAGNOSIS — Z1329 Encounter for screening for other suspected endocrine disorder: Secondary | ICD-10-CM

## 2019-01-19 DIAGNOSIS — Z130 Encounter for screening for diseases of the blood and blood-forming organs and certain disorders involving the immune mechanism: Secondary | ICD-10-CM

## 2019-01-19 DIAGNOSIS — Z13220 Encounter for screening for lipoid disorders: Secondary | ICD-10-CM

## 2019-01-19 DIAGNOSIS — E785 Hyperlipidemia, unspecified: Secondary | ICD-10-CM

## 2019-01-19 DIAGNOSIS — Z0001 Encounter for general adult medical examination with abnormal findings: Secondary | ICD-10-CM

## 2019-01-19 DIAGNOSIS — I10 Essential (primary) hypertension: Secondary | ICD-10-CM

## 2019-01-19 DIAGNOSIS — R351 Nocturia: Secondary | ICD-10-CM

## 2019-01-19 DIAGNOSIS — Z0000 Encounter for general adult medical examination without abnormal findings: Secondary | ICD-10-CM

## 2019-01-19 NOTE — Patient Instructions (Addendum)
   If you have lab work done today you will be contacted with your lab results within the next 2 weeks.  If you have not heard from us then please contact us. The fastest way to get your results is to register for My Chart.   IF you received an x-ray today, you will receive an invoice from [facility]. Please contact [facility] at [phone] with questions or concerns regarding your invoice.   IF you received labwork today, you will receive an invoice from [facility]. Please contact [facility] at [phone] with questions or concerns regarding your invoice.   Our billing staff will not be able to assist you with questions regarding bills from these companies.  You will be contacted with the lab results as soon as they are available. The fastest way to get your results is to activate your My Chart account. Instructions are located on the last page of this paperwork. If you have not heard from us regarding the results in 2 weeks, please contact this office.     Health Maintenance, Male Adopting a healthy lifestyle and getting preventive care are important in promoting health and wellness. Ask your health care provider about:  The right schedule for you to have regular tests and exams.  Things you can do on your own to prevent diseases and keep yourself healthy. What should I know about diet, weight, and exercise? Eat a healthy diet   Eat a diet that includes plenty of vegetables, fruits, low-fat dairy products, and lean protein.  Do not eat a lot of foods that are high in solid fats, added sugars, or sodium. Maintain a healthy weight Body mass index (BMI) is a measurement that can be used to identify possible weight problems. It estimates body fat based on height and weight. Your health care provider can help determine your BMI and help you achieve or maintain a healthy weight. Get regular exercise Get regular exercise. This is one of the most important things you  can do for your health. Most adults should:  Exercise for at least 150 minutes each week. The exercise should increase your heart rate and make you sweat (moderate-intensity exercise).  Do strengthening exercises at least twice a week. This is in addition to the moderate-intensity exercise.  Spend less time sitting. Even light physical activity can be beneficial. Watch cholesterol and blood lipids Have your blood tested for lipids and cholesterol at 68 years of age, then have this test every 5 years. You may need to have your cholesterol levels checked more often if:  Your lipid or cholesterol levels are high.  You are older than 68 years of age.  You are at high risk for heart disease. What should I know about cancer screening? Many types of cancers can be detected early and may often be prevented. Depending on your health history and family history, you may need to have cancer screening at various ages. This may include screening for:  Colorectal cancer.  Prostate cancer.  Skin cancer.  Lung cancer. What should I know about heart disease, diabetes, and high blood pressure? Blood pressure and heart disease  High blood pressure causes heart disease and increases the risk of stroke. This is more likely to develop in people who have high blood pressure readings, are of African descent, or are overweight.  Talk with your health care provider about your target blood pressure readings.  Have your blood pressure checked: ? Every 3-5 years if you are 18-39 years   of age. ? Every year if you are 40 years old or older.  If you are between the ages of 65 and 75 and are a current or former smoker, ask your health care provider if you should have a one-time screening for abdominal aortic aneurysm (AAA). Diabetes Have regular diabetes screenings. This checks your fasting blood sugar level. Have the screening done:  Once every three years after age 45 if you are at a normal weight and have  a low risk for diabetes.  More often and at a younger age if you are overweight or have a high risk for diabetes. What should I know about preventing infection? Hepatitis B If you have a higher risk for hepatitis B, you should be screened for this virus. Talk with your health care provider to find out if you are at risk for hepatitis B infection. Hepatitis C Blood testing is recommended for:  Everyone born from 1945 through 1965.  Anyone with known risk factors for hepatitis C. Sexually transmitted infections (STIs)  You should be screened each year for STIs, including gonorrhea and chlamydia, if: ? You are sexually active and are younger than 68 years of age. ? You are older than 68 years of age and your health care provider tells you that you are at risk for this type of infection. ? Your sexual activity has changed since you were last screened, and you are at increased risk for chlamydia or gonorrhea. Ask your health care provider if you are at risk.  Ask your health care provider about whether you are at high risk for HIV. Your health care provider may recommend a prescription medicine to help prevent HIV infection. If you choose to take medicine to prevent HIV, you should first get tested for HIV. You should then be tested every 3 months for as long as you are taking the medicine. Follow these instructions at home: Lifestyle  Do not use any products that contain nicotine or tobacco, such as cigarettes, e-cigarettes, and chewing tobacco. If you need help quitting, ask your health care provider.  Do not use street drugs.  Do not share needles.  Ask your health care provider for help if you need support or information about quitting drugs. Alcohol use  Do not drink alcohol if your health care provider tells you not to drink.  If you drink alcohol: ? Limit how much you have to 0-2 drinks a day. ? Be aware of how much alcohol is in your drink. In the [place] one drink equals one 12  oz bottle of beer (355 mL), one 5 oz glass of wine (148 mL), or one 1 oz glass of hard liquor (44 mL). General instructions  Schedule regular health, dental, and eye exams.  Stay current with your vaccines.  Tell your health care provider if: ? You often feel depressed. ? You have ever been abused or do not feel safe at home. Summary  Adopting a healthy lifestyle and getting preventive care are important in promoting health and wellness.  Follow your health care provider's instructions about healthy diet, exercising, and getting tested or screened for diseases.  Follow your health care provider's instructions on monitoring your cholesterol and blood pressure. This information is not intended to replace advice given to you by your health care provider. Make sure you discuss any questions you have with your health care provider. Document Released: 08/25/2007 Document Revised: 02/19/2018 Document Reviewed: 02/19/2018 Elsevier Patient Education  2020 [employer]  

## 2019-01-19 NOTE — Progress Notes (Signed)
Tyler Vasquez 68 y.o.   Chief Complaint  Patient presents with  . Annual Exam    HISTORY OF PRESENT ILLNESS: This is a 68 y.o. male here for his annual exam. Has a history of hypertension on olmesartan-amlodipine-HCTZ daily. Has a history of dyslipidemia on atorvastatin 40 mg daily. Needs colonoscopy and or Cologuard. Has a history of increased PSA.  Saw urologist in the past.  No cancer history. Non-smoker.  Still working regular hours.  Regular sleep.  Good nutrition. Healthy habits.  HPI   Prior to Admission medications   Medication Sig Start Date End Date Taking? Authorizing Provider  Alprostadil (PROSTAGLANDIN E1) POWD 40 mcg/mL by Intracavernosal route daily as needed (erectile dysfunction). May use up to 1 ml (40 mcg)/dose.   Yes [provider]  atorvastatin (LIPITOR) 40 MG tablet Take 1 tablet (40 mg total) by mouth daily at 6 PM. 06/24/18  Yes Janiyah Beery, Eilleen Kempf, MD  Multiple Vitamin (MULTIVITAMIN WITH MINERALS) TABS tablet Take 1 tablet by mouth daily.   Yes [provider]  Olmesartan-amLODIPine-HCTZ 40-5-25 MG TABS Take 1 tablet by mouth daily. 06/24/18  Yes Georgina Quint, MD  pantoprazole (PROTONIX) 40 MG tablet TAKE 1 TABLET(40 MG) BY MOUTH DAILY BEFORE BREAKFAST AS NEEDED 04/14/18  Yes Sherren Mocha, MD  aspirin EC 81 MG tablet Take 1 tablet (81 mg total) by mouth daily. Patient not taking: Reported on 01/19/2019 12/01/16   Sherren Mocha, MD  diclofenac (VOLTAREN) 75 MG EC tablet TAKE 1 TABLET(75 MG) BY MOUTH TWICE DAILY AS NEEDED Patient not taking: Reported on 01/19/2019 01/16/19   Georgina Quint, MD    No Known Allergies  Patient Active Problem List   Diagnosis Date Noted  . PSA elevation 04/16/2012  . PPD positive 04/16/2012  . Essential hypertension, benign 02/13/2012  . Pure hypercholesterolemia 02/13/2012  . ED (erectile dysfunction) 02/13/2012    Past Medical History:  Diagnosis Date  . Abnormal prostate biopsy 04/01/2015    by Dr. Retta Diones. Pt refuses to f/u due recurrent biopsies since 2008 w/o clear progression of disease  . Hep C w/o coma, chronic (HCC) 1999  . HSV-2 (herpes simplex virus 2) infection   . Hyperlipidemia   . Hypertension   . PPD positive 1999  . Syphili, latent 1999  . Tuberculosis 1999    History reviewed. No pertinent surgical history.  Social History   Socioeconomic History  . Marital status: Married    Spouse name: Not on file  . Number of children: Not on file  . Years of education: Not on file  . Highest education level: Associate degree: occupational, Scientist, product/process development, or vocational program  Occupational History  . Occupation: Personnel officer  Social Needs  . Financial resource strain: Not on file  . Food insecurity    Worry: Not on file    Inability: Not on file  . Transportation needs    Medical: Not on file    Non-medical: Not on file  Tobacco Use  . Smoking status: Former Games developer  . Smokeless tobacco: Never Used  Substance and Sexual Activity  . Alcohol use: No  . Drug use: No  . Sexual activity: Yes  Lifestyle  . Physical activity    Days per week: Not on file    Minutes per session: Not on file  . Stress: Not on file  Relationships  . Social Musician on phone: Not on file    Gets together: Not on file  Attends religious service: Not on file    Active member of club or organization: Not on file    Attends meetings of clubs or organizations: Not on file    Relationship status: Not on file  . Intimate partner violence    Fear of current or ex partner: Not on file    Emotionally abused: Not on file    Physically abused: Not on file    Forced sexual activity: Not on file  Other Topics Concern  . Not on file  Social History Narrative   served a 16 year prison term for vehicular manslaughter.   Got GED and then did vocational program to become Personnel officer.   Exercises regularly.    Family History  Problem Relation Age of Onset  .  Hypertension Mother   . Alcohol abuse Father   . Alcohol abuse Other   . Hypertension Other   . Cancer Neg Hx   . Diabetes Neg Hx   . Early death Neg Hx   . Heart disease Neg Hx   . Hyperlipidemia Neg Hx   . Kidney disease Neg Hx   . Stroke Neg Hx      Review of Systems  Constitutional: Negative.  Negative for chills and fever.  HENT: Negative.  Negative for congestion and sore throat.   Respiratory: Negative.  Negative for cough and shortness of breath.   Cardiovascular: Negative.  Negative for chest pain and palpitations.  Gastrointestinal: Negative.  Negative for abdominal pain, diarrhea, nausea and vomiting.  Genitourinary: Negative.  Negative for dysuria and hematuria.       Nocturia  Musculoskeletal: Negative.  Negative for myalgias and neck pain.  Skin: Negative.  Negative for rash.  Neurological: Negative.  Negative for dizziness and headaches.  All other systems reviewed and are negative.  Vitals:   01/19/19 0849  BP: 107/68  Pulse: 62  Resp: 16  Temp: 98 F (36.7 C)  SpO2: 98%     Physical Exam Vitals signs reviewed.  Constitutional:      Appearance: Normal appearance.  HENT:     Head: Normocephalic.     Mouth/Throat:     Mouth: Mucous membranes are moist.     Pharynx: Oropharynx is clear.  Eyes:     Extraocular Movements: Extraocular movements intact.     Conjunctiva/sclera: Conjunctivae normal.     Pupils: Pupils are equal, round, and reactive to light.  Neck:     Musculoskeletal: Normal range of motion and neck supple. No muscular tenderness.     Vascular: No carotid bruit.  Cardiovascular:     Rate and Rhythm: Normal rate and regular rhythm.     Pulses: Normal pulses.     Heart sounds: Normal heart sounds.  Pulmonary:     Effort: Pulmonary effort is normal.     Breath sounds: Normal breath sounds.  Abdominal:     General: Bowel sounds are normal. There is no distension.     Palpations: Abdomen is soft. There is no mass.     Tenderness:  There is no abdominal tenderness.  Musculoskeletal: Normal range of motion.        General: No swelling or tenderness.     Right lower leg: No edema.     Left lower leg: No edema.  Lymphadenopathy:     Cervical: No cervical adenopathy.  Skin:    General: Skin is warm and dry.     Capillary Refill: Capillary refill takes less than 2 seconds.  Neurological:  General: No focal deficit present.     Mental Status: He is alert and oriented to person, place, and time.  Psychiatric:        Mood and Affect: Mood normal.        Behavior: Behavior normal.      ASSESSMENT & PLAN: Tyler GlimpseBernard was seen today for annual exam.  Diagnoses and all orders for this visit:  Routine general medical examination at a health care facility  Need for prophylactic vaccination and inoculation against influenza -     Flu Vaccine QUAD High Dose(Fluad)  Screening for colon cancer -     Cologuard  History of elevated PSA -     PSA(Must document that pt has been informed of limitations of PSA testing.)  Abnormal prostate biopsy -     PSA(Must document that pt has been informed of limitations of PSA testing.)  Nocturia -     PSA(Must document that pt has been informed of limitations of PSA testing.)  Screening for deficiency anemia  Screening for lipoid disorders  Screening for endocrine, metabolic and immunity disorder -     Comprehensive metabolic panel  Essential hypertension, benign -     Comprehensive metabolic panel  Dyslipidemia -     Lipid panel    Patient Instructions       If you have lab work done today you will be contacted with your lab results within the next 2 weeks.  If you have not heard from us then please contact us. The fastest way to get your results is to register for My Chart.   IF you received an x-ray today, you will receive an invoice from [facility]. Please contact [facility] at [phone] with questions or concerns regarding your  invoice.   IF you received labwork today, you will receive an invoice from [facility]. Please contact [facility] at [phone] with questions or concerns regarding your invoice.   Our billing staff will not be able to assist you with questions regarding bills from these companies.  You will be contacted with the lab results as soon as they are available. The fastest way to get your results is to activate your My Chart account. Instructions are located on the last page of this paperwork. If you have not heard from us regarding the results in 2 weeks, please contact this office.      Health Maintenance, Male Adopting a healthy lifestyle and getting preventive care are important in promoting health and wellness. Ask your health care provider about:  The right schedule for you to have regular tests and exams.  Things you can do on your own to prevent diseases and keep yourself healthy. What should I know about diet, weight, and exercise? Eat a healthy diet   Eat a diet that includes plenty of vegetables, fruits, low-fat dairy products, and lean protein.  Do not eat a lot of foods that are high in solid fats, added sugars, or sodium. Maintain a healthy weight Body mass index (BMI) is a measurement that can be used to identify possible weight problems. It estimates body fat based on height and weight. Your health care provider can help determine your BMI and help you achieve or maintain a healthy weight. Get regular exercise Get regular exercise. This is one of the most important things you can do for your health. Most adults should:  Exercise for at least 150 minutes each week. The exercise should increase your heart rate and make you sweat (moderate-intensity exercise).  Do strengthening exercises at least twice a week. This is in addition to the moderate-intensity exercise.  Spend less time sitting. Even light physical activity can be beneficial. Watch cholesterol and blood lipids Have  your blood tested for lipids and cholesterol at 68 years of age, then have this test every 5 years. You may need to have your cholesterol levels checked more often if:  Your lipid or cholesterol levels are high.  You are older than 68 years of age.  You are at high risk for heart disease. What should I know about cancer screening? Many types of cancers can be detected early and may often be prevented. Depending on your health history and family history, you may need to have cancer screening at various ages. This may include screening for:  Colorectal cancer.  Prostate cancer.  Skin cancer.  Lung cancer. What should I know about heart disease, diabetes, and high blood pressure? Blood pressure and heart disease  High blood pressure causes heart disease and increases the risk of stroke. This is more likely to develop in people who have high blood pressure readings, are of African descent, or are overweight.  Talk with your health care provider about your target blood pressure readings.  Have your blood pressure checked: ? Every 3-5 years if you are 97-52 years of age. ? Every year if you are 20 years old or older.  If you are between the ages of 36 and 71 and are a current or former smoker, ask your health care provider if you should have a one-time screening for abdominal aortic aneurysm (AAA). Diabetes Have regular diabetes screenings. This checks your fasting blood sugar level. Have the screening done:  Once every three years after age 66 if you are at a normal weight and have a low risk for diabetes.  More often and at a younger age if you are overweight or have a high risk for diabetes. What should I know about preventing infection? Hepatitis B If you have a higher risk for hepatitis B, you should be screened for this virus. Talk with your health care provider to find out if you are at risk for hepatitis B infection. Hepatitis C Blood testing is recommended for:  Everyone  born from [birth year] through 1965.  Anyone with known risk factors for hepatitis C. Sexually transmitted infections (STIs)  You should be screened each year for STIs, including gonorrhea and chlamydia, if: ? You are sexually active and are younger than 68 years of age. ? You are older than 68 years of age and your health care provider tells you that you are at risk for this type of infection. ? Your sexual activity has changed since you were last screened, and you are at increased risk for chlamydia or gonorrhea. Ask your health care provider if you are at risk.  Ask your health care provider about whether you are at high risk for HIV. Your health care provider may recommend a prescription medicine to help prevent HIV infection. If you choose to take medicine to prevent HIV, you should first get tested for HIV. You should then be tested every 3 months for as long as you are taking the medicine. Follow these instructions at home: Lifestyle  Do not use any products that contain nicotine or tobacco, such as cigarettes, e-cigarettes, and chewing tobacco. If you need help quitting, ask your health care provider.  Do not use street drugs.  Do not share needles.  Ask your health care provider for  help if you need support or information about quitting drugs. Alcohol use  Do not drink alcohol if your health care provider tells you not to drink.  If you drink alcohol: ? Limit how much you have to 0-2 drinks a day. ? Be aware of how much alcohol is in your drink. In the [place] one drink equals one 12 oz bottle of beer (355 mL), one 5 oz glass of wine (148 mL), or one 1 oz glass of hard liquor (44 mL). General instructions  Schedule regular health, dental, and eye exams.  Stay current with your vaccines.  Tell your health care provider if: ? You often feel depressed. ? You have ever been abused or do not feel safe at home. Summary  Adopting a healthy lifestyle and getting preventive care are  important in promoting health and wellness.  Follow your health care provider's instructions about healthy diet, exercising, and getting tested or screened for diseases.  Follow your health care provider's instructions on monitoring your cholesterol and blood pressure. This information is not intended to replace advice given to you by your health care provider. Make sure you discuss any questions you have with your health care provider. Document Released: 08/25/2007 Document Revised: 02/19/2018 Document Reviewed: 02/19/2018 Elsevier Patient Education  2020 Elsevier Inc.      Edwina Barth, MD [facility]

## 2019-01-20 ENCOUNTER — Telehealth: Payer: Self-pay | Admitting: *Deleted

## 2019-01-20 ENCOUNTER — Encounter: Payer: Self-pay | Admitting: Emergency Medicine

## 2019-01-20 LAB — COMPREHENSIVE METABOLIC PANEL
ALT: 32 IU/L (ref 0–44)
AST: 34 IU/L (ref 0–40)
Albumin/Globulin Ratio: 1.5 (ref 1.2–2.2)
Albumin: 4.5 g/dL (ref 3.8–4.8)
Alkaline Phosphatase: 98 IU/L (ref 39–117)
BUN/Creatinine Ratio: 19 (ref 10–24)
BUN: 35 mg/dL — ABNORMAL HIGH (ref 8–27)
Bilirubin Total: 0.6 mg/dL (ref 0.0–1.2)
CO2: 23 mmol/L (ref 20–29)
Calcium: 9.5 mg/dL (ref 8.6–10.2)
Chloride: 102 mmol/L (ref 96–106)
Creatinine, Ser: 1.87 mg/dL — ABNORMAL HIGH (ref 0.76–1.27)
GFR calc Af Amer: 42 mL/min/{1.73_m2} — ABNORMAL LOW (ref >59)
GFR calc non Af Amer: 36 mL/min/{1.73_m2} — ABNORMAL LOW (ref >59)
Globulin, Total: 3 g/dL (ref 1.5–4.5)
Glucose: 92 mg/dL (ref 65–99)
Potassium: 4.9 mmol/L (ref 3.5–5.2)
Sodium: 138 mmol/L (ref 134–144)
Total Protein: 7.5 g/dL (ref 6.0–8.5)

## 2019-01-20 LAB — LIPID PANEL
Chol/HDL Ratio: 4 ratio (ref 0.0–5.0)
Cholesterol, Total: 133 mg/dL (ref 100–199)
HDL: 33 mg/dL — ABNORMAL LOW (ref >39)
LDL Chol Calc (NIH): 72 mg/dL (ref 0–99)
Triglycerides: 164 mg/dL — ABNORMAL HIGH (ref 0–149)
VLDL Cholesterol Cal: 28 mg/dL (ref 5–40)

## 2019-01-20 LAB — PSA: Prostate Specific Ag, Serum: 13.8 ng/mL — ABNORMAL HIGH (ref 0.0–4.0)

## 2019-01-20 NOTE — Telephone Encounter (Signed)
On 12/19/2018, faxed Requisition for Cologuard  Confirmation page 9:43 pm.

## 2019-01-21 ENCOUNTER — Encounter: Payer: Self-pay | Admitting: Emergency Medicine

## 2019-01-21 ENCOUNTER — Telehealth: Payer: Self-pay | Admitting: Emergency Medicine

## 2019-01-21 NOTE — Telephone Encounter (Signed)
Pt has an upcoming appointment with his urologist on 03/20/19. His wife is very concerned that this may be to far out seeing how his numbers are off. Messages from 11/10 and 01/21/19 will be helpful. Please advise at [phone]

## 2019-01-23 NOTE — Addendum Note (Signed)
Addended by: Davina Poke on: 01/23/2019 01:42 PM   Modules accepted: Level of Service

## 2019-01-30 NOTE — Telephone Encounter (Signed)
He has chronic renal insufficiency.  He must pay attention to his hydration and avoid NSAIDs.  He also has a chronically elevated PSA which has been trending up.  He has a urologist he needs to follow-up with as recommended in my message to him via [place].  Thanks.

## 2019-01-30 NOTE — Telephone Encounter (Signed)
Spoke with pt wife and she would like to know if you could put an urgent referral for pt to be seen earlier at the Urologist? Please advise

## 2019-01-30 NOTE — Telephone Encounter (Signed)
This is not a new problem.  He has chronic renal insufficiency and chronic elevation of his PSA.  He has seen the urologist several times in the past.  They can call the urologist's office and request an earlier date if so inclined.  The urologist can also look at the chart and blood results and determine himself if patient needs to be seen sooner.  Thanks.

## 2019-02-05 ENCOUNTER — Other Ambulatory Visit: Payer: Self-pay | Admitting: Emergency Medicine

## 2019-02-05 NOTE — Telephone Encounter (Signed)
Forwarding medication refill request to clinical pool for review. 

## 2019-02-11 LAB — COLOGUARD: Cologuard: NEGATIVE

## 2019-02-24 ENCOUNTER — Other Ambulatory Visit: Payer: Self-pay

## 2019-02-24 ENCOUNTER — Ambulatory Visit (INDEPENDENT_AMBULATORY_CARE_PROVIDER_SITE_OTHER): Payer: BC Managed Care – PPO

## 2019-02-24 ENCOUNTER — Encounter: Payer: Self-pay | Admitting: Emergency Medicine

## 2019-02-24 ENCOUNTER — Ambulatory Visit (INDEPENDENT_AMBULATORY_CARE_PROVIDER_SITE_OTHER): Payer: BC Managed Care – PPO | Admitting: Emergency Medicine

## 2019-02-24 VITALS — BP 118/73 | HR 70 | Temp 98.6°F | Resp 16 | Ht 69.0 in | Wt 194.0 lb

## 2019-02-24 DIAGNOSIS — M7042 Prepatellar bursitis, left knee: Secondary | ICD-10-CM

## 2019-02-24 DIAGNOSIS — I10 Essential (primary) hypertension: Secondary | ICD-10-CM | POA: Diagnosis not present

## 2019-02-24 DIAGNOSIS — M25562 Pain in left knee: Secondary | ICD-10-CM

## 2019-02-24 DIAGNOSIS — E785 Hyperlipidemia, unspecified: Secondary | ICD-10-CM | POA: Diagnosis not present

## 2019-02-24 DIAGNOSIS — Z87898 Personal history of other specified conditions: Secondary | ICD-10-CM

## 2019-02-24 MED ORDER — METHYLPREDNISOLONE ACETATE 80 MG/ML IJ SUSP
80.0000 mg | Freq: Once | INTRAMUSCULAR | Status: AC
  Administered 2019-02-24: 80 mg via INTRAMUSCULAR

## 2019-02-24 MED ORDER — PREDNISONE 20 MG PO TABS: 20.0000 mg | ORAL_TABLET | Freq: Every day | ORAL | 0 refills | Status: AC

## 2019-02-24 NOTE — Patient Instructions (Addendum)
If you have lab work done today you will be contacted with your lab results within the next 2 weeks.  If you have not heard from [place] then please contact us. The fastest way to get your results is to register for My Chart.   IF you received an x-ray today, you will receive an invoice from [facility]. Please contact [facility] at [phone] with questions or concerns regarding your invoice.   IF you received labwork today, you will receive an invoice from [place]. Please contact [facility] at [phone] with questions or concerns regarding your invoice.   Our billing staff will not be able to assist you with questions regarding bills from these companies.  You will be contacted with the lab results as soon as they are available. The fastest way to get your results is to activate your My Chart account. Instructions are located on the last page of this paperwork. If you have not heard from [place] regarding the results in 2 weeks, please contact this office.     Prepatellar Bursitis  Prepatellar bursitis is inflammation of the prepatellar bursa, which is a fluid-filled sac that cushions the kneecap (patella). Prepatellar bursitis happens when fluid builds up in this sac and causes it to swell. The condition causes knee pain. What are the causes? This condition may be caused by:  Constant pressure on the knees from kneeling.  A hit to the knee.  Falling on the knee.  Infection from bacteria.  Moving the knee often in a forceful way. What increases the risk? You are more likely to develop this condition if:  You play sports that have a high risk of falling on the knee or being hit on the knee. These include football, wrestling, basketball, or soccer.  You do work in which you kneel for long periods of time, such as roofing, plumbing, or gardening.  You have another inflammatory condition, such as gout or rheumatoid arthritis. What are the signs or symptoms? The  most common symptom of this condition is knee pain that gets better with rest. Other symptoms include:  Swelling on the front of the kneecap.  Warmth in the knee.  Tenderness with activity.  Redness in the knee.  Inability to bend the knee or to kneel. How is this diagnosed? This condition is diagnosed based on:  A physical exam. Your health care provider will compare your knees and check for tenderness and pain while moving your knee.  Your medical history.  Tests to check for infection. These may include blood tests and tests on the fluid in the bursa.  Imaging tests, such as X-ray, MRI, or ultrasound, to check for damage in the patella, or fluid buildup and swelling in the bursa. How is this treated? This condition may be treated by:  Resting the knee.  Putting ice on the knee.  Taking medicines, such as: ? NSAIDs. These can help to reduce pain and swelling. ? Antibiotics. These may be needed if you have an infection. ? Steroids. These are used to reduce swelling and inflammation, and may be prescribed if other treatments are not helping.  Raising (elevating) the knee while resting.  Doing exercises to help you maintain movement (physical therapy). These may be recommended after pain and swelling improve.  Having a procedure to remove fluid from the bursa. This may be done if other treatments are not helping.  Having surgery to remove the bursa. This may be done if you have a severe infection  or if the condition keeps coming back after treatment. Follow these instructions at home: Medicines  Take over-the-counter and prescription medicines only as told by your health care provider.  If you were prescribed an antibiotic medicine, take it as told by your health care provider. Do not stop taking the antibiotic even if you start to feel better. Managing pain, stiffness, and swelling   If directed, put ice on the injured area. ? Put ice in a plastic bag. ? Place a  towel between your skin and the bag. ? Leave the ice on for 20 minutes, 2-3 times a day.  Elevate the injured area above the level of your heart while you are sitting or lying down. Activity  Do not use the injured limb to support your body weight until your health care provider says that you can.  Rest your knee.  Avoid activities that cause knee pain.  Return to your normal activities as told by your health care provider. Ask your health care provider what activities are safe for you.  Do exercises as told by your health care provider. General instructions  Ask your health care provider when it is safe for you to drive.  Do not use any products that contain nicotine or tobacco, such as cigarettes, e-cigarettes, and chewing tobacco. These can delay healing. If you need help quitting, ask your health care provider.  Keep all follow-up visits as told by your health care provider. This is important. How is this prevented?  Warm up and stretch before being active.  Cool down and stretch after being active.  Give your body time to rest between periods of activity.  Maintain physical fitness, including strength and flexibility.  Be safe and responsible while being active. This will help you to avoid falls.  Wear knee pads if you have to kneel for a long period of time. Contact a health care provider if:  Your symptoms do not improve or get worse.  Your symptoms keep coming back after treatment.  You develop a fever and have warmth, redness, or swelling over your knee. Summary  Prepatellar bursitis is inflammation of the prepatellar bursa, which is a fluid-filled sac that cushions the kneecap (patella).  This condition may be caused by injury or constant pressure on the knee. It may also be caused by an infection from bacteria.  Symptoms of this condition include pain, swelling, warmth, and tenderness in the knee.  Follow instructions from your health care provider about  taking medicines, resting, and doing activities.  Contact your health care provider if your symptoms do not improve, get worse, or keep coming back after treatment. This information is not intended to replace advice given to you by your health care provider. Make sure you discuss any questions you have with your health care provider. Document Released: 02/26/2005 Document Revised: 06/20/2018 Document Reviewed: 05/08/2018 Elsevier Patient Education  2020 [employer].

## 2019-02-24 NOTE — Progress Notes (Signed)
Tyler Vasquez 68 y.o.   Chief Complaint  Patient presents with  . Knee Pain    x 2 days LEFT with slight swelling    HISTORY OF PRESENT ILLNESS: This is a 68 y.o. male complaining of pain to left knee with mild swelling that started 2 days ago.  Was working on his knees resting them on a hard surface.  Feels that rubbing them against a hard surface resulted in swelling and pain. Most recent blood work results reviewed with patient.  Has increased PSA with renal insufficiency.  Has appointment with his urologist coming up in the next several weeks.  No history of prostate cancer.  Advised to stay well-hydrated and avoid NSAIDs. HPI   Prior to Admission medications   Medication Sig Start Date End Date Taking? Authorizing Provider  Alprostadil (PROSTAGLANDIN E1) POWD 40 mcg/mL by Intracavernosal route daily as needed (erectile dysfunction). May use up to 1 ml (40 mcg)/dose.   Yes [provider]  atorvastatin (LIPITOR) 40 MG tablet Take 1 tablet (40 mg total) by mouth daily at 6 PM. 06/24/18  Yes Camil Wilhelmsen, Ines Bloomer, MD  diclofenac (VOLTAREN) 75 MG EC tablet TAKE 1 TABLET(75 MG) BY MOUTH TWICE DAILY AS NEEDED 01/16/19  Yes Tillmon Kisling, Ines Bloomer, MD  Multiple Vitamin (MULTIVITAMIN WITH MINERALS) TABS tablet Take 1 tablet by mouth daily.   Yes [provider]  Olmesartan-amLODIPine-HCTZ 40-5-25 MG TABS Take 1 tablet by mouth daily. 06/24/18  Yes Horald Pollen, MD  pantoprazole (PROTONIX) 40 MG tablet TAKE 1 TABLET(40 MG) BY MOUTH DAILY BEFORE BREAKFAST AS NEEDED 04/14/18  Yes Shawnee Knapp, MD  aspirin EC 81 MG tablet Take 1 tablet (81 mg total) by mouth daily. Patient not taking: Reported on 02/24/2019 12/01/16   Shawnee Knapp, MD    No Known Allergies  Patient Active Problem List   Diagnosis Date Noted  . Essential hypertension, benign 02/13/2012  . Pure hypercholesterolemia 02/13/2012  . ED (erectile dysfunction) 02/13/2012    Past Medical History:  Diagnosis  Date  . Abnormal prostate biopsy 04/01/2015   by Dr. Diona Fanti. Pt refuses to f/u due recurrent biopsies since 2008 w/o clear progression of disease  . Hep C w/o coma, chronic ([place]) 1999  . HSV-2 (herpes simplex virus 2) infection   . Hyperlipidemia   . Hypertension   . PPD positive 1999  . Syphili, latent 1999  . Tuberculosis 1999    History reviewed. No pertinent surgical history.  Social History   Socioeconomic History  . Marital status: Married    Spouse name: Not on file  . Number of children: Not on file  . Years of education: Not on file  . Highest education level: Associate degree: occupational, Hotel manager, or vocational program  Occupational History  . Occupation: Clinical biochemist  Tobacco Use  . Smoking status: Former Research scientist (life sciences)  . Smokeless tobacco: Never Used  Substance and Sexual Activity  . Alcohol use: No  . Drug use: No  . Sexual activity: Yes  Other Topics Concern  . Not on file  Social History Narrative   served a 74 year prison term for vehicular manslaughter.   Got GED and then did vocational program to become Clinical biochemist.   Exercises regularly.   Social Determinants of Health   Financial Resource Strain:   . Difficulty of Paying Living Expenses: Not on file  Food Insecurity:   . Worried About Charity fundraiser in the Last Year: Not on file  . Ran Out of  Food in the Last Year: Not on file  Transportation Needs:   . Lack of Transportation (Medical): Not on file  . Lack of Transportation (Non-Medical): Not on file  Physical Activity:   . Days of Exercise per Week: Not on file  . Minutes of Exercise per Session: Not on file  Stress:   . Feeling of Stress : Not on file  Social Connections:   . Frequency of Communication with Friends and Family: Not on file  . Frequency of Social Gatherings with Friends and Family: Not on file  . Attends Religious Services: Not on file  . Active Member of Clubs or Organizations: Not on file  . Attends Tax inspector Meetings: Not on file  . Marital Status: Not on file  Intimate Partner Violence:   . Fear of Current or Ex-Partner: Not on file  . Emotionally Abused: Not on file  . Physically Abused: Not on file  . Sexually Abused: Not on file    Family History  Problem Relation Age of Onset  . Hypertension Mother   . Alcohol abuse Father   . Alcohol abuse Other   . Hypertension Other   . Cancer Neg Hx   . Diabetes Neg Hx   . Early death Neg Hx   . Heart disease Neg Hx   . Hyperlipidemia Neg Hx   . Kidney disease Neg Hx   . Stroke Neg Hx      Review of Systems  Constitutional: Negative.  Negative for chills and fever.  HENT: Negative.  Negative for congestion and sore throat.   Respiratory: Negative.  Negative for cough and shortness of breath.   Cardiovascular: Negative.  Negative for chest pain and palpitations.  Gastrointestinal: Negative.  Negative for abdominal pain, diarrhea, nausea and vomiting.  Genitourinary: Negative.  Negative for dysuria and hematuria.  Musculoskeletal: Positive for joint pain (Left knee). Negative for myalgias and neck pain.  Skin: Negative.  Negative for rash.  Neurological: Negative.  Negative for dizziness and headaches.  All other systems reviewed and are negative.     Today's Vitals   02/24/19 1001  BP: 118/73  Pulse: 70  Resp: 16  Temp: 98.6 F (37 C)  TempSrc: Oral  SpO2: 98%  Weight: 194 lb (88 kg)  Height:  (1.753 m)   Body mass index is 28.65 kg/m.  Physical Exam Vitals reviewed.  Constitutional:      Appearance: Normal appearance.  HENT:     Head: Normocephalic.  Eyes:     Extraocular Movements: Extraocular movements intact.     Pupils: Pupils are equal, round, and reactive to light.  Cardiovascular:     Rate and Rhythm: Normal rate.  Pulmonary:     Effort: Pulmonary effort is normal.  Musculoskeletal:     Cervical back: Normal range of motion.     Comments: Left knee: Mild swelling and tenderness to  prepatellar area.  Full range of motion.  Stable in flexion and extension.  No ecchymosis.  Skin:    General: Skin is warm and dry.     Capillary Refill: Capillary refill takes less than 2 seconds.  Neurological:     General: No focal deficit present.     Mental Status: He is alert and oriented to person, place, and time.  Psychiatric:        Mood and Affect: Mood normal.        Behavior: Behavior normal.    DG Knee Complete 4 Views Left  Result Date: 02/24/2019 CLINICAL DATA:  Injured knee.  Hurts to stand. EXAM: LEFT KNEE - COMPLETE 4+ VIEW COMPARISON:  None. FINDINGS: No fracture or bone lesion. Knee joint normally spaced and aligned. No arthropathic/degenerative changes. No joint effusion. There is prepatellar soft tissue swelling. IMPRESSION: 1. No fracture or dislocation. 2. No arthropathic changes 3. Anterior soft tissue swelling. Electronically Signed   By: Amie Portlandavid  Ormond M.D.   On: 02/24/2019 10:33     ASSESSMENT & PLAN: Tyler Vasquez was seen today for knee pain.  Diagnoses and all orders for this visit:  Acute pain of left knee -     DG Knee Complete 4 Views Left; Future  Prepatellar bursitis of left knee -     methylPREDNISolone acetate (DEPO-MEDROL) injection 80 mg -     predniSONE (DELTASONE) 20 MG tablet; Take 1 tablet (20 mg total) by mouth daily with breakfast for 5 days.  Essential hypertension, benign  Dyslipidemia  History of elevated PSA    Patient Instructions       If you have lab work done today you will be contacted with your lab results within the next 2 weeks.  If you have not heard from us then please contact us. The fastest way to get your results is to register for My Chart.   IF you received an x-ray today, you will receive an invoice from [facility]. Please contact [facility] at [phone] with questions or concerns regarding your invoice.   IF you received labwork today, you will receive an invoice from [facility]. Please  contact [facility] at [phone] with questions or concerns regarding your invoice.   Our billing staff will not be able to assist you with questions regarding bills from these companies.  You will be contacted with the lab results as soon as they are available. The fastest way to get your results is to activate your My Chart account. Instructions are located on the last page of this paperwork. If you have not heard from us regarding the results in 2 weeks, please contact this office.     Prepatellar Bursitis  Prepatellar bursitis is inflammation of the prepatellar bursa, which is a fluid-filled sac that cushions the kneecap (patella). Prepatellar bursitis happens when fluid builds up in this sac and causes it to swell. The condition causes knee pain. What are the causes? This condition may be caused by:  Constant pressure on the knees from kneeling.  A hit to the knee.  Falling on the knee.  Infection from bacteria.  Moving the knee often in a forceful way. What increases the risk? You are more likely to develop this condition if:  You play sports that have a high risk of falling on the knee or being hit on the knee. These include football, wrestling, basketball, or soccer.  You do work in which you kneel for long periods of time, such as roofing, plumbing, or gardening.  You have another inflammatory condition, such as gout or rheumatoid arthritis. What are the signs or symptoms? The most common symptom of this condition is knee pain that gets better with rest. Other symptoms include:  Swelling on the front of the kneecap.  Warmth in the knee.  Tenderness with activity.  Redness in the knee.  Inability to bend the knee or to kneel. How is this diagnosed? This condition is diagnosed based on:  A physical exam. Your health care provider will compare your knees and check for tenderness and pain while moving your knee.  Your medical history.  Tests to check for  infection. These may include blood tests and tests on the fluid in the bursa.  Imaging tests, such as X-ray, MRI, or ultrasound, to check for damage in the patella, or fluid buildup and swelling in the bursa. How is this treated? This condition may be treated by:  Resting the knee.  Putting ice on the knee.  Taking medicines, such as: ? NSAIDs. These can help to reduce pain and swelling. ? Antibiotics. These may be needed if you have an infection. ? Steroids. These are used to reduce swelling and inflammation, and may be prescribed if other treatments are not helping.  Raising (elevating) the knee while resting.  Doing exercises to help you maintain movement (physical therapy). These may be recommended after pain and swelling improve.  Having a procedure to remove fluid from the bursa. This may be done if other treatments are not helping.  Having surgery to remove the bursa. This may be done if you have a severe infection or if the condition keeps coming back after treatment. Follow these instructions at home: Medicines  Take over-the-counter and prescription medicines only as told by your health care provider.  If you were prescribed an antibiotic medicine, take it as told by your health care provider. Do not stop taking the antibiotic even if you start to feel better. Managing pain, stiffness, and swelling   If directed, put ice on the injured area. ? Put ice in a plastic bag. ? Place a towel between your skin and the bag. ? Leave the ice on for 20 minutes, 2-3 times a day.  Elevate the injured area above the level of your heart while you are sitting or lying down. Activity  Do not use the injured limb to support your body weight until your health care provider says that you can.  Rest your knee.  Avoid activities that cause knee pain.  Return to your normal activities as told by your health care provider. Ask your health care provider what activities are safe for you.   Do exercises as told by your health care provider. General instructions  Ask your health care provider when it is safe for you to drive.  Do not use any products that contain nicotine or tobacco, such as cigarettes, e-cigarettes, and chewing tobacco. These can delay healing. If you need help quitting, ask your health care provider.  Keep all follow-up visits as told by your health care provider. This is important. How is this prevented?  Warm up and stretch before being active.  Cool down and stretch after being active.  Give your body time to rest between periods of activity.  Maintain physical fitness, including strength and flexibility.  Be safe and responsible while being active. This will help you to avoid falls.  Wear knee pads if you have to kneel for a long period of time. Contact a health care provider if:  Your symptoms do not improve or get worse.  Your symptoms keep coming back after treatment.  You develop a fever and have warmth, redness, or swelling over your knee. Summary  Prepatellar bursitis is inflammation of the prepatellar bursa, which is a fluid-filled sac that cushions the kneecap (patella).  This condition may be caused by injury or constant pressure on the knee. It may also be caused by an infection from bacteria.  Symptoms of this condition include pain, swelling, warmth, and tenderness in the knee.  Follow instructions from your health care provider  about taking medicines, resting, and doing activities.  Contact your health care provider if your symptoms do not improve, get worse, or keep coming back after treatment. This information is not intended to replace advice given to you by your health care provider. Make sure you discuss any questions you have with your health care provider. Document Released: 02/26/2005 Document Revised: 06/20/2018 Document Reviewed: 05/08/2018 Elsevier Patient Education  2020 [employer]      Edwina Barth,  MD [facility]

## 2019-04-15 ENCOUNTER — Other Ambulatory Visit: Payer: Self-pay | Admitting: Urology

## 2019-04-15 DIAGNOSIS — R972 Elevated prostate specific antigen [PSA]: Secondary | ICD-10-CM

## 2019-05-11 ENCOUNTER — Other Ambulatory Visit: Payer: Self-pay

## 2019-05-11 ENCOUNTER — Ambulatory Visit
Admission: RE | Admit: 2019-05-11 | Discharge: 2019-05-11 | Disposition: A | Discharge status: Home / Self Care | Payer: BC Managed Care – PPO | Source: Ambulatory Visit | Attending: Urology | Admitting: Urology

## 2019-05-11 DIAGNOSIS — R972 Elevated prostate specific antigen [PSA]: Secondary | ICD-10-CM

## 2019-05-11 MED ORDER — GADOBENATE DIMEGLUMINE 529 MG/ML IV SOLN
8.0000 mL | Freq: Once | INTRAVENOUS | Status: AC | PRN
  Administered 2019-05-11: 8 mL via INTRAVENOUS

## 2019-06-18 ENCOUNTER — Other Ambulatory Visit: Payer: Self-pay | Admitting: *Deleted

## 2019-06-18 DIAGNOSIS — E7800 Pure hypercholesterolemia, unspecified: Secondary | ICD-10-CM

## 2019-06-18 MED ORDER — ATORVASTATIN CALCIUM 40 MG PO TABS: 40.0000 mg | ORAL_TABLET | Freq: Every day | ORAL | 0 refills | Status: DC

## 2019-06-25 ENCOUNTER — Other Ambulatory Visit: Payer: Self-pay | Admitting: Emergency Medicine

## 2019-06-25 DIAGNOSIS — I10 Essential (primary) hypertension: Secondary | ICD-10-CM

## 2019-09-01 ENCOUNTER — Ambulatory Visit (INDEPENDENT_AMBULATORY_CARE_PROVIDER_SITE_OTHER): Payer: BC Managed Care – PPO | Admitting: Emergency Medicine

## 2019-09-01 ENCOUNTER — Encounter: Payer: Self-pay | Admitting: Emergency Medicine

## 2019-09-01 ENCOUNTER — Other Ambulatory Visit: Payer: Self-pay

## 2019-09-01 VITALS — BP 144/81 | HR 70 | Temp 98.0°F | Resp 16 | Ht 69.0 in | Wt 197.0 lb

## 2019-09-01 DIAGNOSIS — I10 Essential (primary) hypertension: Secondary | ICD-10-CM

## 2019-09-01 DIAGNOSIS — E785 Hyperlipidemia, unspecified: Secondary | ICD-10-CM

## 2019-09-01 LAB — COMPREHENSIVE METABOLIC PANEL
ALT: 32 IU/L (ref 0–44)
AST: 31 IU/L (ref 0–40)
Albumin/Globulin Ratio: 1.4 (ref 1.2–2.2)
Albumin: 4.3 g/dL (ref 3.8–4.8)
Alkaline Phosphatase: 100 IU/L (ref 48–121)
BUN/Creatinine Ratio: 12 (ref 10–24)
BUN: 22 mg/dL (ref 8–27)
Bilirubin Total: 0.6 mg/dL (ref 0.0–1.2)
CO2: 21 mmol/L (ref 20–29)
Calcium: 9.5 mg/dL (ref 8.6–10.2)
Chloride: 103 mmol/L (ref 96–106)
Creatinine, Ser: 1.85 mg/dL — ABNORMAL HIGH (ref 0.76–1.27)
GFR calc Af Amer: 42 mL/min/{1.73_m2} — ABNORMAL LOW (ref >59)
GFR calc non Af Amer: 37 mL/min/{1.73_m2} — ABNORMAL LOW (ref >59)
Globulin, Total: 3.1 g/dL (ref 1.5–4.5)
Glucose: 95 mg/dL (ref 65–99)
Potassium: 4.4 mmol/L (ref 3.5–5.2)
Sodium: 137 mmol/L (ref 134–144)
Total Protein: 7.4 g/dL (ref 6.0–8.5)

## 2019-09-01 LAB — LIPID PANEL
Chol/HDL Ratio: 4.6 ratio (ref 0.0–5.0)
Cholesterol, Total: 147 mg/dL (ref 100–199)
HDL: 32 mg/dL — ABNORMAL LOW (ref >39)
LDL Chol Calc (NIH): 73 mg/dL (ref 0–99)
Triglycerides: 260 mg/dL — ABNORMAL HIGH (ref 0–149)
VLDL Cholesterol Cal: 42 mg/dL — ABNORMAL HIGH (ref 5–40)

## 2019-09-01 NOTE — Progress Notes (Signed)
Tyler Vasquez 69 y.o.   Chief Complaint  Patient presents with  . Hypertension    follow up 6 month    HISTORY OF PRESENT ILLNESS: This is a 69 y.o. male with history of hypertension here for follow-up.  Doing well.  Normal blood pressure readings at home. Has no complaints or medical concerns today.  Presently taking olmesartan-amlodipine-hydrochlorothiazide 40-5-25, Lipitor 40 mg daily and 1 baby aspirin daily. Recently saw urologist and had prostate MRI which was normal. BP Readings from Last 3 Encounters:  09/01/19 (!) 144/81  02/24/19 118/73  01/19/19 107/68  Good nutrition.  Physically active.  Still working as an Personnel officer regular hours. Fully vaccinated against Covid.  HPI   Prior to Admission medications   Medication Sig Start Date End Date Taking? Authorizing Provider  Alprostadil (PROSTAGLANDIN E1) POWD 40 mcg/mL by Intracavernosal route daily as needed (erectile dysfunction). May use up to 1 ml (40 mcg)/dose.   Yes [provider]  atorvastatin (LIPITOR) 40 MG tablet Take 1 tablet (40 mg total) by mouth daily at 6 PM. 06/18/19  Yes Corry Storie, Eilleen Kempf, MD  Multiple Vitamin (MULTIVITAMIN WITH MINERALS) TABS tablet Take 1 tablet by mouth daily.   Yes [provider]  Olmesartan-amLODIPine-HCTZ 40-5-25 MG TABS TAKE 1 TABLET BY MOUTH DAILY 06/25/19  Yes Elvina Bosch, Eilleen Kempf, MD  pantoprazole (PROTONIX) 40 MG tablet TAKE 1 TABLET(40 MG) BY MOUTH DAILY BEFORE BREAKFAST AS NEEDED 04/14/18  Yes Sherren Mocha, MD  aspirin EC 81 MG tablet Take 1 tablet (81 mg total) by mouth daily. Patient not taking: Reported on 09/01/2019 12/01/16   Sherren Mocha, MD  diclofenac (VOLTAREN) 75 MG EC tablet TAKE 1 TABLET(75 MG) BY MOUTH TWICE DAILY AS NEEDED Patient not taking: Reported on 09/01/2019 01/16/19   Georgina Quint, MD    No Known Allergies  Patient Active Problem List   Diagnosis Date Noted  . Essential hypertension, benign 02/13/2012  . Pure  hypercholesterolemia 02/13/2012  . ED (erectile dysfunction) 02/13/2012    Past Medical History:  Diagnosis Date  . Abnormal prostate biopsy 04/01/2015   by Dr. Retta Diones. Pt refuses to f/u due recurrent biopsies since 2008 w/o clear progression of disease  . Hep C w/o coma, chronic (HCC) 1999  . HSV-2 (herpes simplex virus 2) infection   . Hyperlipidemia   . Hypertension   . PPD positive 1999  . Syphili, latent 1999  . Tuberculosis 1999    History reviewed. No pertinent surgical history.  Social History   Socioeconomic History  . Marital status: Married    Spouse name: Not on file  . Number of children: Not on file  . Years of education: Not on file  . Highest education level: Associate degree: occupational, Scientist, product/process development, or vocational program  Occupational History  . Occupation: Personnel officer  Tobacco Use  . Smoking status: Former Games developer  . Smokeless tobacco: Never Used  Vaping Use  . Vaping Use: Never used  Substance and Sexual Activity  . Alcohol use: No  . Drug use: No  . Sexual activity: Yes  Other Topics Concern  . Not on file  Social History Narrative   served a 16 year prison term for vehicular manslaughter.   Got GED and then did vocational program to become Personnel officer.   Exercises regularly.   Social Determinants of Health   Financial Resource Strain:   . Difficulty of Paying Living Expenses:   Food Insecurity:   . Worried About Programme researcher, broadcasting/film/video in the  Last Year:   . Ran Out of Food in the Last Year:   Transportation Needs:   . Freight forwarder (Medical):   Marland Kitchen Lack of Transportation (Non-Medical):   Physical Activity:   . Days of Exercise per Week:   . Minutes of Exercise per Session:   Stress:   . Feeling of Stress :   Social Connections:   . Frequency of Communication with Friends and Family:   . Frequency of Social Gatherings with Friends and Family:   . Attends Religious Services:   . Active Member of Clubs or Organizations:   .  Attends Banker Meetings:   Marland Kitchen Marital Status:   Intimate Partner Violence:   . Fear of Current or Ex-Partner:   . Emotionally Abused:   Marland Kitchen Physically Abused:   . Sexually Abused:     Family History  Problem Relation Age of Onset  . Hypertension Mother   . Alcohol abuse Father   . Alcohol abuse Other   . Hypertension Other   . Cancer Neg Hx   . Diabetes Neg Hx   . Early death Neg Hx   . Heart disease Neg Hx   . Hyperlipidemia Neg Hx   . Kidney disease Neg Hx   . Stroke Neg Hx      Review of Systems  Constitutional: Negative.  Negative for chills and fever.  HENT: Negative.  Negative for congestion and sore throat.   Respiratory: Negative.  Negative for cough and shortness of breath.   Cardiovascular: Negative.  Negative for chest pain and palpitations.  Gastrointestinal: Negative.  Negative for abdominal pain, blood in stool, diarrhea, melena, nausea and vomiting.  Genitourinary: Negative.  Negative for dysuria and hematuria.  Skin: Negative.  Negative for rash.  Neurological: Negative.  Negative for dizziness and headaches.  Endo/Heme/Allergies: Negative.   All other systems reviewed and are negative.  Today's Vitals   09/01/19 0809  BP: (!) 144/81  Pulse: 70  Resp: 16  Temp: 98 F (36.7 C)  TempSrc: Temporal  SpO2: 96%  Weight: 197 lb (89.4 kg)  Height: 5\' 9"  (1.753 m)   Body mass index is 29.09 kg/m.   Physical Exam Vitals reviewed.  Constitutional:      Appearance: Normal appearance.  HENT:     Head: Normocephalic.  Eyes:     Extraocular Movements: Extraocular movements intact.     Conjunctiva/sclera: Conjunctivae normal.     Pupils: Pupils are equal, round, and reactive to light.  Cardiovascular:     Rate and Rhythm: Normal rate and regular rhythm.     Pulses: Normal pulses.     Heart sounds: Normal heart sounds.  Pulmonary:     Effort: Pulmonary effort is normal.     Breath sounds: Normal breath sounds.  Abdominal:     General:  Bowel sounds are normal. There is no distension.     Palpations: Abdomen is soft.     Tenderness: There is no abdominal tenderness.  Musculoskeletal:        General: Normal range of motion.     Cervical back: Normal range of motion and neck supple.  Skin:    General: Skin is warm and dry.     Capillary Refill: Capillary refill takes less than 2 seconds.  Neurological:     General: No focal deficit present.     Mental Status: He is alert and oriented to person, place, and time.  Psychiatric:        Mood  and Affect: Mood normal.        Behavior: Behavior normal.    A total of 30 minutes was spent with the patient, greater than 50% of which was in counseling/coordination of care regarding hypertension and cardiovascular risks associated with this condition, review of all medications, health maintenance items, review of most recent office visit notes, review of most recent blood work results, diet and nutrition, prognosis and need for follow-up.   ASSESSMENT & PLAN: Essential hypertension, benign Clinically stable.  Well-controlled hypertension.  Continue present medications.  No changes.  Fasting blood work done today. Follow-up in 6 to 12 months.  Brenner was seen today for hypertension.  Diagnoses and all orders for this visit:  Essential hypertension, benign -     Comprehensive metabolic panel  Dyslipidemia -     Lipid panel    Patient Instructions       If you have lab work done today you will be contacted with your lab results within the next 2 weeks.  If you have not heard from [place] then please contact us. The fastest way to get your results is to register for My Chart.   IF you received an x-ray today, you will receive an invoice from [facility]. Please contact [facility] at [phone] with questions or concerns regarding your invoice.   IF you received labwork today, you will receive an invoice from [place]. Please contact [facility] at  [phone] with questions or concerns regarding your invoice.   Our billing staff will not be able to assist you with questions regarding bills from these companies.  You will be contacted with the lab results as soon as they are available. The fastest way to get your results is to activate your My Chart account. Instructions are located on the last page of this paperwork. If you have not heard from [place] regarding the results in 2 weeks, please contact this office.     Hypertension, Adult High blood pressure (hypertension) is when the force of blood pumping through the arteries is too strong. The arteries are the blood vessels that carry blood from the heart throughout the body. Hypertension forces the heart to work harder to pump blood and may cause arteries to become narrow or stiff. Untreated or uncontrolled hypertension can cause a heart attack, heart failure, a stroke, kidney disease, and other problems. A blood pressure reading consists of a higher number over a lower number. Ideally, your blood pressure should be below 120/80. The first ("top") number is called the systolic pressure. It is a measure of the pressure in your arteries as your heart beats. The second ("bottom") number is called the diastolic pressure. It is a measure of the pressure in your arteries as the heart relaxes. What are the causes? The exact cause of this condition is not known. There are some conditions that result in or are related to high blood pressure. What increases the risk? Some risk factors for high blood pressure are under your control. The following factors may make you more likely to develop this condition:  Smoking.  Having type 2 diabetes mellitus, high cholesterol, or both.  Not getting enough exercise or physical activity.  Being overweight.  Having too much fat, sugar, calories, or salt (sodium) in your diet.  Drinking too much alcohol. Some risk factors for high blood pressure may be  difficult or impossible to change. Some of these factors include:  Having chronic kidney disease.  Having a family history of high blood  pressure.  Age. Risk increases with age.  Race. You may be at higher risk if you are African American.  Gender. Men are at higher risk than women before age 66. After age 62, women are at higher risk than men.  Having obstructive sleep apnea.  Stress. What are the signs or symptoms? High blood pressure may not cause symptoms. Very high blood pressure (hypertensive crisis) may cause:  Headache.  Anxiety.  Shortness of breath.  Nosebleed.  Nausea and vomiting.  Vision changes.  Severe chest pain.  Seizures. How is this diagnosed? This condition is diagnosed by measuring your blood pressure while you are seated, with your arm resting on a flat surface, your legs uncrossed, and your feet flat on the floor. The cuff of the blood pressure monitor will be placed directly against the skin of your upper arm at the level of your heart. It should be measured at least twice using the same arm. Certain conditions can cause a difference in blood pressure between your right and left arms. Certain factors can cause blood pressure readings to be lower or higher than normal for a short period of time:  When your blood pressure is higher when you are in a health care provider's office than when you are at home, this is called white coat hypertension. Most people with this condition do not need medicines.  When your blood pressure is higher at home than when you are in a health care provider's office, this is called masked hypertension. Most people with this condition may need medicines to control blood pressure. If you have a high blood pressure reading during one visit or you have normal blood pressure with other risk factors, you may be asked to:  Return on a different day to have your blood pressure checked again.  Monitor your blood pressure at home for  1 week or longer. If you are diagnosed with hypertension, you may have other blood or imaging tests to help your health care provider understand your overall risk for other conditions. How is this treated? This condition is treated by making healthy lifestyle changes, such as eating healthy foods, exercising more, and reducing your alcohol intake. Your health care provider may prescribe medicine if lifestyle changes are not enough to get your blood pressure under control, and if:  Your systolic blood pressure is above 130.  Your diastolic blood pressure is above 80. Your personal target blood pressure may vary depending on your medical conditions, your age, and other factors. Follow these instructions at home: Eating and drinking   Eat a diet that is high in fiber and potassium, and low in sodium, added sugar, and fat. An example eating plan is called the DASH (Dietary Approaches to Stop Hypertension) diet. To eat this way: ? Eat plenty of fresh fruits and vegetables. Try to fill one half of your plate at each meal with fruits and vegetables. ? Eat whole grains, such as whole-wheat pasta, brown rice, or whole-grain bread. Fill about one fourth of your plate with whole grains. ? Eat or drink low-fat dairy products, such as skim milk or low-fat yogurt. ? Avoid fatty cuts of meat, processed or cured meats, and poultry with skin. Fill about one fourth of your plate with lean proteins, such as fish, chicken without skin, beans, eggs, or tofu. ? Avoid pre-made and processed foods. These tend to be higher in sodium, added sugar, and fat.  Reduce your daily sodium intake. Most people with hypertension should eat less  than 1,500 mg of sodium a day.  Do not drink alcohol if: ? Your health care provider tells you not to drink. ? You are pregnant, may be pregnant, or are planning to become pregnant.  If you drink alcohol: ? Limit how much you use to:  0-1 drink a day for women.  0-2 drinks a day  for men. ? Be aware of how much alcohol is in your drink. In the [place] one drink equals one 12 oz bottle of beer (355 mL), one 5 oz glass of wine (148 mL), or one 1 oz glass of hard liquor (44 mL). Lifestyle   Work with your health care provider to maintain a healthy body weight or to lose weight. Ask what an ideal weight is for you.  Get at least 30 minutes of exercise most days of the week. Activities may include walking, swimming, or biking.  Include exercise to strengthen your muscles (resistance exercise), such as Pilates or lifting weights, as part of your weekly exercise routine. Try to do these types of exercises for 30 minutes at least 3 days a week.  Do not use any products that contain nicotine or tobacco, such as cigarettes, e-cigarettes, and chewing tobacco. If you need help quitting, ask your health care provider.  Monitor your blood pressure at home as told by your health care provider.  Keep all follow-up visits as told by your health care provider. This is important. Medicines  Take over-the-counter and prescription medicines only as told by your health care provider. Follow directions carefully. Blood pressure medicines must be taken as prescribed.  Do not skip doses of blood pressure medicine. Doing this puts you at risk for problems and can make the medicine less effective.  Ask your health care provider about side effects or reactions to medicines that you should watch for. Contact a health care provider if you:  Think you are having a reaction to a medicine you are taking.  Have headaches that keep coming back (recurring).  Feel dizzy.  Have swelling in your ankles.  Have trouble with your vision. Get help right away if you:  Develop a severe headache or confusion.  Have unusual weakness or numbness.  Feel faint.  Have severe pain in your chest or abdomen.  Vomit repeatedly.  Have trouble breathing. Summary  Hypertension is when the force of  blood pumping through your arteries is too strong. If this condition is not controlled, it may put you at risk for serious complications.  Your personal target blood pressure may vary depending on your medical conditions, your age, and other factors. For most people, a normal blood pressure is less than 120/80.  Hypertension is treated with lifestyle changes, medicines, or a combination of both. Lifestyle changes include losing weight, eating a healthy, low-sodium diet, exercising more, and limiting alcohol. This information is not intended to replace advice given to you by your health care provider. Make sure you discuss any questions you have with your health care provider. Document Revised: 11/06/2017 Document Reviewed: 11/06/2017 Elsevier Patient Education  2020 [employer]      Edwina BarthMiguel Cherene Dobbins, MD [facility]

## 2019-09-01 NOTE — Patient Instructions (Addendum)
   If you have lab work done today you will be contacted with your lab results within the next 2 weeks.  If you have not heard from us then please contact us. The fastest way to get your results is to register for My Chart.   IF you received an x-ray today, you will receive an invoice from [facility]. Please contact [facility] at [phone] with questions or concerns regarding your invoice.   IF you received labwork today, you will receive an invoice from [facility]. Please contact [facility] at [phone] with questions or concerns regarding your invoice.   Our billing staff will not be able to assist you with questions regarding bills from these companies.  You will be contacted with the lab results as soon as they are available. The fastest way to get your results is to activate your My Chart account. Instructions are located on the last page of this paperwork. If you have not heard from us regarding the results in 2 weeks, please contact this office.      Hypertension, Adult High blood pressure (hypertension) is when the force of blood pumping through the arteries is too strong. The arteries are the blood vessels that carry blood from the heart throughout the body. Hypertension forces the heart to work harder to pump blood and may cause arteries to become narrow or stiff. Untreated or uncontrolled hypertension can cause a heart attack, heart failure, a stroke, kidney disease, and other problems. A blood pressure reading consists of a higher number over a lower number. Ideally, your blood pressure should be below 120/80. The first ("top") number is called the systolic pressure. It is a measure of the pressure in your arteries as your heart beats. The second ("bottom") number is called the diastolic pressure. It is a measure of the pressure in your arteries as the heart relaxes. What are the causes? The exact cause of this condition is not known. There are some conditions  that result in or are related to high blood pressure. What increases the risk? Some risk factors for high blood pressure are under your control. The following factors may make you more likely to develop this condition:  Smoking.  Having type 2 diabetes mellitus, high cholesterol, or both.  Not getting enough exercise or physical activity.  Being overweight.  Having too much fat, sugar, calories, or salt (sodium) in your diet.  Drinking too much alcohol. Some risk factors for high blood pressure may be difficult or impossible to change. Some of these factors include:  Having chronic kidney disease.  Having a family history of high blood pressure.  Age. Risk increases with age.  Race. You may be at higher risk if you are African American.  Gender. Men are at higher risk than women before age 45. After age 65, women are at higher risk than men.  Having obstructive sleep apnea.  Stress. What are the signs or symptoms? High blood pressure may not cause symptoms. Very high blood pressure (hypertensive crisis) may cause:  Headache.  Anxiety.  Shortness of breath.  Nosebleed.  Nausea and vomiting.  Vision changes.  Severe chest pain.  Seizures. How is this diagnosed? This condition is diagnosed by measuring your blood pressure while you are seated, with your arm resting on a flat surface, your legs uncrossed, and your feet flat on the floor. The cuff of the blood pressure monitor will be placed directly against the skin of your upper arm at the level of your   heart. It should be measured at least twice using the same arm. Certain conditions can cause a difference in blood pressure between your right and left arms. Certain factors can cause blood pressure readings to be lower or higher than normal for a short period of time:  When your blood pressure is higher when you are in a health care provider's office than when you are at home, this is called white coat hypertension.  Most people with this condition do not need medicines.  When your blood pressure is higher at home than when you are in a health care provider's office, this is called masked hypertension. Most people with this condition may need medicines to control blood pressure. If you have a high blood pressure reading during one visit or you have normal blood pressure with other risk factors, you may be asked to:  Return on a different day to have your blood pressure checked again.  Monitor your blood pressure at home for 1 week or longer. If you are diagnosed with hypertension, you may have other blood or imaging tests to help your health care provider understand your overall risk for other conditions. How is this treated? This condition is treated by making healthy lifestyle changes, such as eating healthy foods, exercising more, and reducing your alcohol intake. Your health care provider may prescribe medicine if lifestyle changes are not enough to get your blood pressure under control, and if:  Your systolic blood pressure is above 130.  Your diastolic blood pressure is above 80. Your personal target blood pressure may vary depending on your medical conditions, your age, and other factors. Follow these instructions at home: Eating and drinking   Eat a diet that is high in fiber and potassium, and low in sodium, added sugar, and fat. An example eating plan is called the DASH (Dietary Approaches to Stop Hypertension) diet. To eat this way: ? Eat plenty of fresh fruits and vegetables. Try to fill one half of your plate at each meal with fruits and vegetables. ? Eat whole grains, such as whole-wheat pasta, brown rice, or whole-grain bread. Fill about one fourth of your plate with whole grains. ? Eat or drink low-fat dairy products, such as skim milk or low-fat yogurt. ? Avoid fatty cuts of meat, processed or cured meats, and poultry with skin. Fill about one fourth of your plate with lean proteins, such  as fish, chicken without skin, beans, eggs, or tofu. ? Avoid pre-made and processed foods. These tend to be higher in sodium, added sugar, and fat.  Reduce your daily sodium intake. Most people with hypertension should eat less than 1,500 mg of sodium a day.  Do not drink alcohol if: ? Your health care provider tells you not to drink. ? You are pregnant, may be pregnant, or are planning to become pregnant.  If you drink alcohol: ? Limit how much you use to:  0-1 drink a day for women.  0-2 drinks a day for men. ? Be aware of how much alcohol is in your drink. In the [place] one drink equals one 12 oz bottle of beer (355 mL), one 5 oz glass of wine (148 mL), or one 1 oz glass of hard liquor (44 mL). Lifestyle   Work with your health care provider to maintain a healthy body weight or to lose weight. Ask what an ideal weight is for you.  Get at least 30 minutes of exercise most days of the week. Activities may include walking, swimming,   or biking.  Include exercise to strengthen your muscles (resistance exercise), such as Pilates or lifting weights, as part of your weekly exercise routine. Try to do these types of exercises for 30 minutes at least 3 days a week.  Do not use any products that contain nicotine or tobacco, such as cigarettes, e-cigarettes, and chewing tobacco. If you need help quitting, ask your health care provider.  Monitor your blood pressure at home as told by your health care provider.  Keep all follow-up visits as told by your health care provider. This is important. Medicines  Take over-the-counter and prescription medicines only as told by your health care provider. Follow directions carefully. Blood pressure medicines must be taken as prescribed.  Do not skip doses of blood pressure medicine. Doing this puts you at risk for problems and can make the medicine less effective.  Ask your health care provider about side effects or reactions to medicines that you  should watch for. Contact a health care provider if you:  Think you are having a reaction to a medicine you are taking.  Have headaches that keep coming back (recurring).  Feel dizzy.  Have swelling in your ankles.  Have trouble with your vision. Get help right away if you:  Develop a severe headache or confusion.  Have unusual weakness or numbness.  Feel faint.  Have severe pain in your chest or abdomen.  Vomit repeatedly.  Have trouble breathing. Summary  Hypertension is when the force of blood pumping through your arteries is too strong. If this condition is not controlled, it may put you at risk for serious complications.  Your personal target blood pressure may vary depending on your medical conditions, your age, and other factors. For most people, a normal blood pressure is less than 120/80.  Hypertension is treated with lifestyle changes, medicines, or a combination of both. Lifestyle changes include losing weight, eating a healthy, low-sodium diet, exercising more, and limiting alcohol. This information is not intended to replace advice given to you by your health care provider. Make sure you discuss any questions you have with your health care provider. Document Revised: 11/06/2017 Document Reviewed: 11/06/2017 Elsevier Patient Education  2020 [employer]  

## 2019-09-01 NOTE — Assessment & Plan Note (Signed)
Clinically stable.  Well-controlled hypertension.  Continue present medications.  No changes.  Fasting blood work done today. Follow-up in 6 to 12 months.

## 2019-09-24 ENCOUNTER — Other Ambulatory Visit: Payer: Self-pay | Admitting: Emergency Medicine

## 2019-09-24 DIAGNOSIS — I10 Essential (primary) hypertension: Secondary | ICD-10-CM

## 2019-11-11 ENCOUNTER — Other Ambulatory Visit: Payer: Self-pay | Admitting: Emergency Medicine

## 2019-11-11 DIAGNOSIS — E7800 Pure hypercholesterolemia, unspecified: Secondary | ICD-10-CM

## 2020-01-28 ENCOUNTER — Other Ambulatory Visit: Payer: Self-pay | Admitting: Emergency Medicine

## 2020-01-28 DIAGNOSIS — E7800 Pure hypercholesterolemia, unspecified: Secondary | ICD-10-CM

## 2020-03-30 ENCOUNTER — Other Ambulatory Visit: Payer: Self-pay | Admitting: Emergency Medicine

## 2020-03-30 DIAGNOSIS — I10 Essential (primary) hypertension: Secondary | ICD-10-CM

## 2020-03-30 NOTE — Telephone Encounter (Signed)
Patient is requesting a refill of the following medications: Requested Prescriptions   Pending Prescriptions Disp Refills   Olmesartan-amLODIPine-HCTZ 40-5-25 MG TABS [Pharmacy Med Name: OLMESARTAN/AMLOD/HCTZ 40-5-25MG  T] 90 tablet 1    Sig: TAKE 1 TABLET BY MOUTH DAILY    Date of patient request: 03/30/20 Last office visit: 09/01/19 Date of last refill: 09/24/19 Last refill amount: 90 + 1 Follow up time period per chart: 09/05/20

## 2020-04-27 ENCOUNTER — Other Ambulatory Visit: Payer: Self-pay | Admitting: Emergency Medicine

## 2020-04-27 DIAGNOSIS — E7800 Pure hypercholesterolemia, unspecified: Secondary | ICD-10-CM

## 2020-04-27 NOTE — Telephone Encounter (Signed)
Requested Prescriptions  Pending Prescriptions Disp Refills  . atorvastatin (LIPITOR) 40 MG tablet [Pharmacy Med Name: ATORVASTATIN 40MG  TABLETS] 90 tablet 1    Sig: TAKE 1 TABLET(40 MG) BY MOUTH DAILY AT 6 PM     Cardiovascular:  Antilipid - Statins Failed - 04/27/2020  3:47 AM      Failed - LDL in normal range and within 360 days    LDL Chol Calc (NIH)  Date Value Ref Range Status  09/01/2019 73 0 - 99 mg/dL Final   Direct LDL  Date Value Ref Range Status  11/20/2014 56 <130 mg/dL Final    Comment:    ATP III Classification (LDL):       < 100        mg/dL         Optimal      01/20/2015 - 129     mg/dL         Near or Above Optimal      130 - 159     mg/dL         Borderline High      160 - 189     mg/dL         High       > 287        mg/dL         Very High            Failed - HDL in normal range and within 360 days    HDL  Date Value Ref Range Status  09/01/2019 32 (L) >39 mg/dL Final         Failed - Triglycerides in normal range and within 360 days    Triglycerides  Date Value Ref Range Status  09/01/2019 260 (H) 0 - 149 mg/dL Final         Passed - Total Cholesterol in normal range and within 360 days    Cholesterol, Total  Date Value Ref Range Status  09/01/2019 147 100 - 199 mg/dL Final         Passed - Patient is not pregnant      Passed - Valid encounter within last 12 months    Recent Outpatient Visits          7 months ago Essential hypertension, benign   [facility] [place], Janice[place], MD   1 year ago Acute pain of left knee   [facility] [place], Janicefort, MD   1 year ago Routine general medical examination at a health care facility   [facility] [place], Janice[place], MD   1 year ago Essential hypertension, benign   [facility] at [facility], MD   2 years ago Annual physical exam   [facility], Etta Grandchild, MD      Future Appointments            In 4 months Sagardia, Levell July, MD [facility] at [place], [facility]

## 2020-07-25 IMAGING — MR MR PROSTATE WO/W CM
56 series · 56 of 56 positions shown · IV contrast (8 ML MULTIHANCE)
Comparison: None.

CLINICAL DATA: Elevated PSA on 04/07/2019 (PSA of 13.8 increased
from 8.5 on 11/16/2016). Prior biopsy without prior MRI. History of
high-grade prostate intraepithelial neoplasia and atypia on previous
biopsy.

EXAM:
MR PROSTATE WITHOUT AND WITH CONTRAST
TECHNIQUE: Multiplanar multisequence MRI images were obtained of the pelvis
centered about the prostate. Pre and post contrast images were
obtained.
Creatinine was obtained on site at [HOSPITAL] at [HOSPITAL].
Results: Creatinine 1.8 mg/dL.
CONTRAST:  8mL MULTIHANCE GADOBENATE DIMEGLUMINE 529 MG/ML IV SOLN

[Series 4: bSSFP fat-sat · axial · 8.0mm · 0.74mm/px · 1 of 28 slices shown]
[im 1/28]
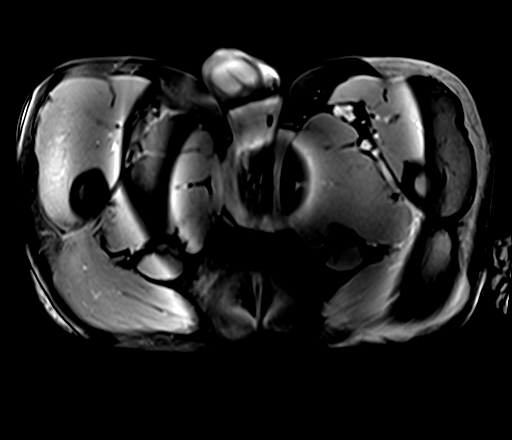

[Series 5: T1 · axial · 5.0mm · 1.25mm/px · 1 of 80 slices shown]
[im 1/80]
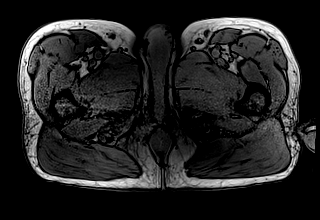

[Series 6: T2 · coronal · 3.5mm · 0.56mm/px · 1 of 23 slices shown (1 of 3)]
[im 1/23]
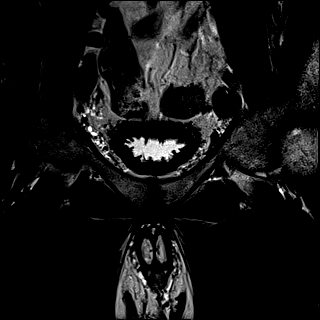

[Series 7: DWI · axial · 3.5mm · 1.75mm/px · 1 of 66 slices shown (1 of 3)]
[im 1/66]
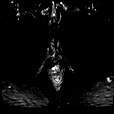

[Series 8: DWI · axial · 3.5mm · 1.75mm/px · 1 of 22 slices shown (2 of 3)]
[im 1/22]
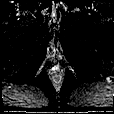

[Series 9: DWI · axial · 3.5mm · 1.56mm/px · 1 of 22 slices shown (3 of 3)]
[im 1/22]
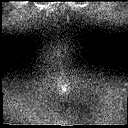

[Series 10: T2 · axial · 3.5mm · 0.56mm/px · 1 of 23 slices shown (2 of 3)]
[im 1/23]
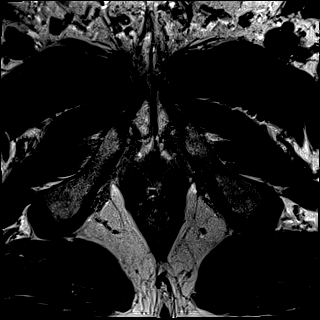

[Series 11: T2 · axial · 1.0mm · 1.04mm/px · 1 of 80 slices shown (3 of 3)]
[im 1/80]
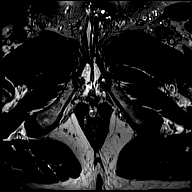

[Series 12: pre t1_twist_tra_dyn_ttc=5.3s · axial · non-contrast · 3.5mm · 0.83mm/px · 1 of 20 slices shown]
[im 1/20]
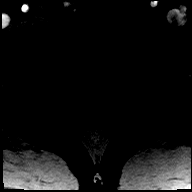

[Series 13: post t1_twist_tra_dyn-copy center · axial · 3.5mm · 0.83mm/px · 1 of 20 slices shown (1 of 24)]
[im 1/20]
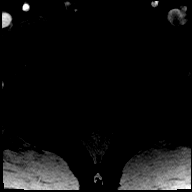

[Series 14: post t1_twist_tra_dyn-copy center · axial · 3.5mm · 0.83mm/px · 1 of 20 slices shown (2 of 24)]
[im 1/20]
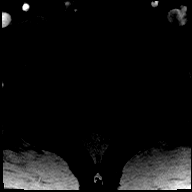

[Series 15: post t1_twist_tra_dyn-copy cent_sub_ttc=(id) · axial · 3.5mm · 0.83mm/px · 1 of 20 slices shown (1 of 23)]
[im 1/20]
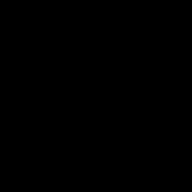

[Series 16: post t1_twist_tra_dyn-copy center · axial · 3.5mm · 0.83mm/px · 1 of 20 slices shown (3 of 24)]
[im 1/20]
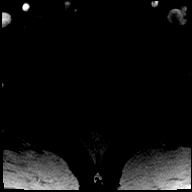

[Series 17: post t1_twist_tra_dyn-copy cent_sub_ttc=(id) · axial · 3.5mm · 0.83mm/px · 1 of 20 slices shown (2 of 23)]
[im 1/20]
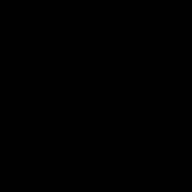

[Series 18: post t1_twist_tra_dyn-copy center · axial · 3.5mm · 0.83mm/px · 1 of 20 slices shown (4 of 24)]
[im 1/20]
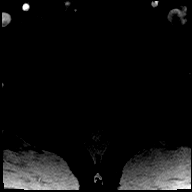

[Series 19: post t1_twist_tra_dyn-copy cent_sub_ttc=(id) · axial · 3.5mm · 0.83mm/px · 1 of 20 slices shown (3 of 23)]
[im 1/20]
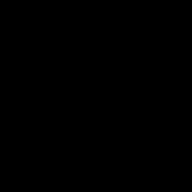

[Series 20: post t1_twist_tra_dyn-copy center · axial · 3.5mm · 0.83mm/px · 1 of 20 slices shown (5 of 24)]
[im 1/20]
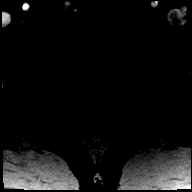

[Series 21: post t1_twist_tra_dyn-copy cent_sub_ttc=(id) · axial · 3.5mm · 0.83mm/px · 1 of 20 slices shown (4 of 23)]
[im 1/20]
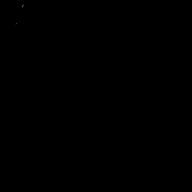

[Series 22: post t1_twist_tra_dyn-copy center · axial · 3.5mm · 0.83mm/px · 1 of 20 slices shown (6 of 24)]
[im 1/20]
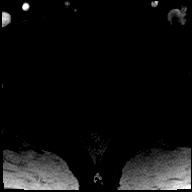

[Series 23: post t1_twist_tra_dyn-copy cent_sub_ttc=(id) · axial · 3.5mm · 0.83mm/px · 1 of 20 slices shown (5 of 23)]
[im 1/20]
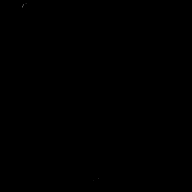

[Series 24: post t1_twist_tra_dyn-copy center · axial · 3.5mm · 0.83mm/px · 1 of 20 slices shown (7 of 24)]
[im 1/20]
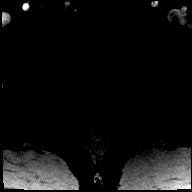

[Series 25: post t1_twist_tra_dyn-copy cent_sub_ttc=(id) · axial · 3.5mm · 0.83mm/px · 1 of 20 slices shown (6 of 23)]
[im 1/20]
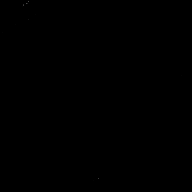

[Series 26: post t1_twist_tra_dyn-copy center · axial · 3.5mm · 0.83mm/px · 1 of 20 slices shown (8 of 24)]
[im 1/20]
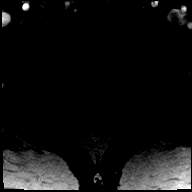

[Series 27: post t1_twist_tra_dyn-copy cent_sub_ttc=(id) · axial · 3.5mm · 0.83mm/px · 1 of 20 slices shown (7 of 23)]
[im 1/20]
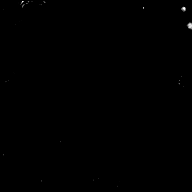

[Series 28: post t1_twist_tra_dyn-copy center · axial · 3.5mm · 0.83mm/px · 1 of 20 slices shown (9 of 24)]
[im 1/20]
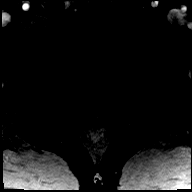

[Series 29: post t1_twist_tra_dyn-copy cent_sub_ttc=(id) · axial · 3.5mm · 0.83mm/px · 1 of 20 slices shown (8 of 23)]
[im 1/20]
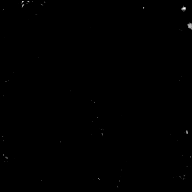

[Series 30: post t1_twist_tra_dyn-copy center · axial · 3.5mm · 0.83mm/px · 1 of 20 slices shown (10 of 24)]
[im 1/20]
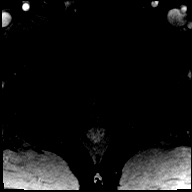

[Series 31: post t1_twist_tra_dyn-copy cent_sub_ttc=(id) · axial · 3.5mm · 0.83mm/px · 1 of 20 slices shown (9 of 23)]
[im 1/20]
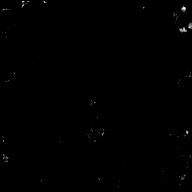

[Series 32: post t1_twist_tra_dyn-copy center · axial · 3.5mm · 0.83mm/px · 1 of 20 slices shown (11 of 24)]
[im 1/20]
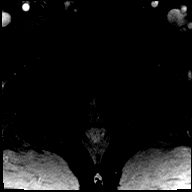

[Series 33: post t1_twist_tra_dyn-copy cent_sub_ttc=(id) · axial · 3.5mm · 0.83mm/px · 1 of 20 slices shown (10 of 23)]
[im 1/20]
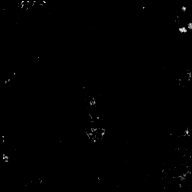

[Series 34: post t1_twist_tra_dyn-copy center · axial · 3.5mm · 0.83mm/px · 1 of 20 slices shown (12 of 24)]
[im 1/20]
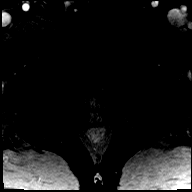

[Series 35: post t1_twist_tra_dyn-copy cent_sub_ttc=(id) · axial · 3.5mm · 0.83mm/px · 1 of 20 slices shown (11 of 23)]
[im 1/20]
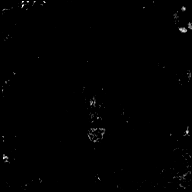

[Series 36: post t1_twist_tra_dyn-copy center · axial · 3.5mm · 0.83mm/px · 1 of 20 slices shown (13 of 24)]
[im 1/20]
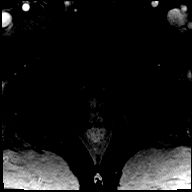

[Series 37: post t1_twist_tra_dyn-copy cent_sub_ttc=(id) · axial · 3.5mm · 0.83mm/px · 1 of 20 slices shown (12 of 23)]
[im 1/20]
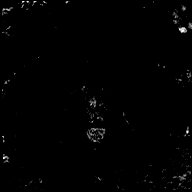

[Series 38: post t1_twist_tra_dyn-copy center · axial · 3.5mm · 0.83mm/px · 1 of 20 slices shown (14 of 24)]
[im 1/20]
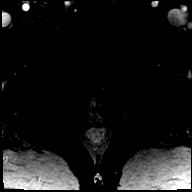

[Series 39: post t1_twist_tra_dyn-copy cent_sub_ttc=(id) · axial · 3.5mm · 0.83mm/px · 1 of 20 slices shown (13 of 23)]
[im 1/20]
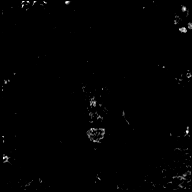

[Series 40: post t1_twist_tra_dyn-copy center · axial · 3.5mm · 0.83mm/px · 1 of 20 slices shown (15 of 24)]
[im 1/20]
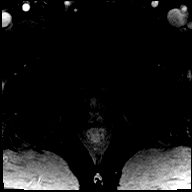

[Series 41: post t1_twist_tra_dyn-copy cent_sub_ttc=(id) · axial · 3.5mm · 0.83mm/px · 1 of 20 slices shown (14 of 23)]
[im 1/20]
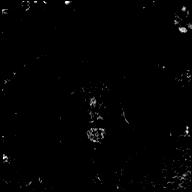

[Series 42: post t1_twist_tra_dyn-copy center · axial · 3.5mm · 0.83mm/px · 1 of 20 slices shown (16 of 24)]
[im 1/20]
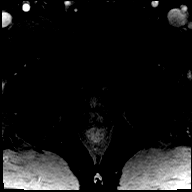

[Series 43: post t1_twist_tra_dyn-copy cent_sub_ttc=(id) · axial · 3.5mm · 0.83mm/px · 1 of 20 slices shown (15 of 23)]
[im 1/20]
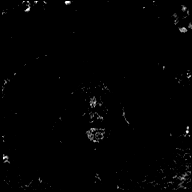

[Series 44: post t1_twist_tra_dyn-copy center · axial · 3.5mm · 0.83mm/px · 1 of 20 slices shown (17 of 24)]
[im 1/20]
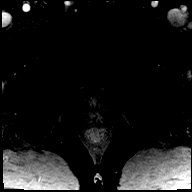

[Series 45: post t1_twist_tra_dyn-copy cent_sub_ttc=(id) · axial · 3.5mm · 0.83mm/px · 1 of 20 slices shown (16 of 23)]
[im 1/20]
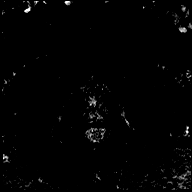

[Series 46: post t1_twist_tra_dyn-copy center · axial · 3.5mm · 0.83mm/px · 1 of 20 slices shown (18 of 24)]
[im 1/20]
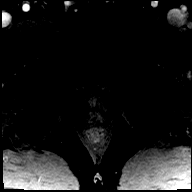

[Series 47: post t1_twist_tra_dyn-copy cent_sub_ttc=(id) · axial · 3.5mm · 0.83mm/px · 1 of 20 slices shown (17 of 23)]
[im 1/20]
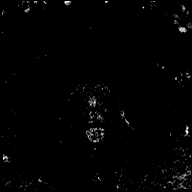

[Series 48: post t1_twist_tra_dyn-copy center · axial · 3.5mm · 0.83mm/px · 1 of 20 slices shown (19 of 24)]
[im 1/20]
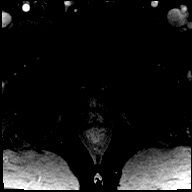

[Series 49: post t1_twist_tra_dyn-copy cent_sub_ttc=(id) · axial · 3.5mm · 0.83mm/px · 1 of 20 slices shown (18 of 23)]
[im 1/20]
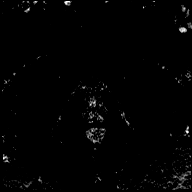

[Series 50: post t1_twist_tra_dyn-copy center · axial · 3.5mm · 0.83mm/px · 1 of 20 slices shown (20 of 24)]
[im 1/20]
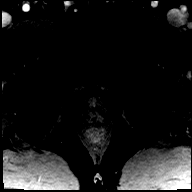

[Series 51: post t1_twist_tra_dyn-copy cent_sub_ttc=(id) · axial · 3.5mm · 0.83mm/px · 1 of 20 slices shown (19 of 23)]
[im 1/20]
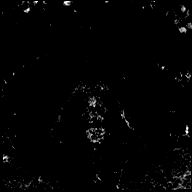

[Series 52: post t1_twist_tra_dyn-copy center · axial · 3.5mm · 0.83mm/px · 1 of 20 slices shown (21 of 24)]
[im 1/20]
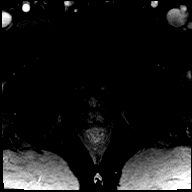

[Series 53: post t1_twist_tra_dyn-copy cent_sub_ttc=(id) · axial · 3.5mm · 0.83mm/px · 1 of 20 slices shown (20 of 23)]
[im 1/20]
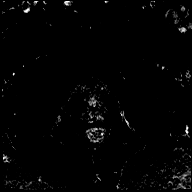

[Series 54: post t1_twist_tra_dyn-copy center · axial · 3.5mm · 0.83mm/px · 1 of 20 slices shown (22 of 24)]
[im 1/20]
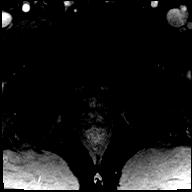

[Series 55: post t1_twist_tra_dyn-copy cent_sub_ttc=(id) · axial · 3.5mm · 0.83mm/px · 1 of 20 slices shown (21 of 23)]
[im 1/20]
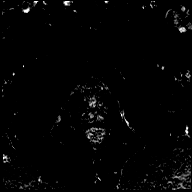

[Series 56: post t1_twist_tra_dyn-copy center · axial · 3.5mm · 0.83mm/px · 1 of 20 slices shown (23 of 24)]
[im 1/20]
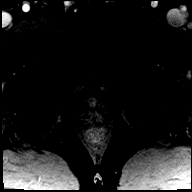

[Series 57: post t1_twist_tra_dyn-copy cent_sub_ttc=(id) · axial · 3.5mm · 0.83mm/px · 1 of 20 slices shown (22 of 23)]
[im 1/20]
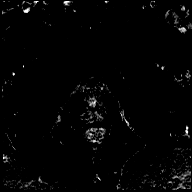

[Series 58: post t1_twist_tra_dyn-copy center · axial · 3.5mm · 0.83mm/px · 1 of 20 slices shown (24 of 24)]
[im 1/20]
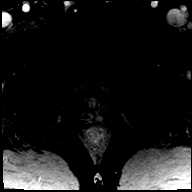

[Series 59: post t1_twist_tra_dyn-copy cent_sub_ttc=(id) · axial · 3.5mm · 0.83mm/px · 1 of 20 slices shown (23 of 23)]
[im 1/20]
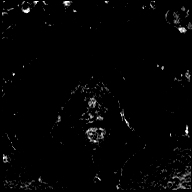

[56 of 56 positions shown; findings below may reference images not displayed]

FINDINGS: Prostate: Prostatomegaly with signs of BPH and marked thinning of
the peripheral zone.

Transitional zone: No signs of high-risk lesion with numerous BPH
nodules.

Peripheral zone: No signs of high-risk lesion. Scattered areas of
presumed prostatitis.

Volume: 5.8 x 4.7 x 6.0 (volume = 86 cc) cm

Transcapsular spread:  Absent

Seminal vesicle involvement: Absent

Neurovascular bundle involvement: Absent

Pelvic adenopathy: Absent

Bone metastasis: Absent

Other findings: None
IMPRESSION: 1. Signs of BPH and prostatitis without signs of high-risk lesion.

## 2020-09-05 ENCOUNTER — Encounter: Payer: Self-pay | Admitting: Emergency Medicine

## 2020-10-31 ENCOUNTER — Other Ambulatory Visit: Payer: Self-pay | Admitting: Emergency Medicine

## 2020-10-31 DIAGNOSIS — E7800 Pure hypercholesterolemia, unspecified: Secondary | ICD-10-CM

## 2021-01-13 ENCOUNTER — Other Ambulatory Visit: Payer: Self-pay | Admitting: Emergency Medicine

## 2021-01-13 DIAGNOSIS — I10 Essential (primary) hypertension: Secondary | ICD-10-CM

## 2021-07-12 ENCOUNTER — Other Ambulatory Visit: Payer: Self-pay | Admitting: Emergency Medicine

## 2021-07-12 DIAGNOSIS — E7800 Pure hypercholesterolemia, unspecified: Secondary | ICD-10-CM

## 2021-08-08 ENCOUNTER — Other Ambulatory Visit: Payer: Self-pay | Admitting: Urology

## 2021-08-08 DIAGNOSIS — R972 Elevated prostate specific antigen [PSA]: Secondary | ICD-10-CM
